# Patient Record
Sex: Female | Born: 1975 | Race: White | Hispanic: No | Marital: Single | Smoking: Never smoker
Health system: Southern US, Community
[De-identification: ages and names within clinical notes are randomized; demographics above are authoritative.]

## PROBLEM LIST (undated history)

## (undated) ENCOUNTER — Inpatient Hospital Stay (HOSPITAL_COMMUNITY): Payer: Self-pay

## (undated) DIAGNOSIS — Z87898 Personal history of other specified conditions: Secondary | ICD-10-CM

## (undated) DIAGNOSIS — K219 Gastro-esophageal reflux disease without esophagitis: Secondary | ICD-10-CM

## (undated) DIAGNOSIS — IMO0001 Reserved for inherently not codable concepts without codable children: Secondary | ICD-10-CM

## (undated) DIAGNOSIS — J45909 Unspecified asthma, uncomplicated: Secondary | ICD-10-CM

## (undated) DIAGNOSIS — B009 Herpesviral infection, unspecified: Secondary | ICD-10-CM

## (undated) DIAGNOSIS — Z9889 Other specified postprocedural states: Secondary | ICD-10-CM

## (undated) DIAGNOSIS — O021 Missed abortion: Secondary | ICD-10-CM

## (undated) DIAGNOSIS — R112 Nausea with vomiting, unspecified: Secondary | ICD-10-CM

## (undated) HISTORY — PX: WISDOM TOOTH EXTRACTION: SHX21

---

## 2003-11-18 ENCOUNTER — Emergency Department (HOSPITAL_COMMUNITY): Admission: EM | Admit: 2003-11-18 | Discharge: 2003-11-18 | Payer: Self-pay | Admitting: Emergency Medicine

## 2005-05-11 ENCOUNTER — Other Ambulatory Visit: Admission: RE | Admit: 2005-05-11 | Discharge: 2005-05-11 | Payer: Self-pay | Admitting: Obstetrics and Gynecology

## 2005-11-16 ENCOUNTER — Inpatient Hospital Stay (HOSPITAL_COMMUNITY): Admission: AD | Admit: 2005-11-16 | Discharge: 2005-11-18 | Payer: Self-pay | Admitting: *Deleted

## 2006-08-18 ENCOUNTER — Emergency Department (HOSPITAL_COMMUNITY): Admission: EM | Admit: 2006-08-18 | Discharge: 2006-08-18 | Payer: Self-pay | Admitting: Family Medicine

## 2007-02-02 ENCOUNTER — Emergency Department (HOSPITAL_COMMUNITY): Admission: EM | Admit: 2007-02-02 | Discharge: 2007-02-02 | Payer: Self-pay | Admitting: Family Medicine

## 2007-02-04 ENCOUNTER — Emergency Department (HOSPITAL_COMMUNITY): Admission: EM | Admit: 2007-02-04 | Discharge: 2007-02-04 | Payer: Self-pay | Admitting: Family Medicine

## 2007-05-02 ENCOUNTER — Inpatient Hospital Stay (HOSPITAL_COMMUNITY): Admission: AD | Admit: 2007-05-02 | Discharge: 2007-05-02 | Payer: Self-pay | Admitting: Obstetrics and Gynecology

## 2007-05-06 ENCOUNTER — Inpatient Hospital Stay (HOSPITAL_COMMUNITY): Admission: AD | Admit: 2007-05-06 | Discharge: 2007-05-06 | Payer: Self-pay | Admitting: Pediatrics

## 2007-05-19 ENCOUNTER — Inpatient Hospital Stay (HOSPITAL_COMMUNITY): Admission: AD | Admit: 2007-05-19 | Discharge: 2007-05-21 | Payer: Self-pay | Admitting: Obstetrics and Gynecology

## 2007-06-08 ENCOUNTER — Inpatient Hospital Stay (HOSPITAL_COMMUNITY): Admission: AD | Admit: 2007-06-08 | Discharge: 2007-06-08 | Payer: Self-pay | Admitting: Obstetrics and Gynecology

## 2009-01-27 ENCOUNTER — Emergency Department (HOSPITAL_COMMUNITY): Admission: EM | Admit: 2009-01-27 | Discharge: 2009-01-27 | Payer: Self-pay | Admitting: Emergency Medicine

## 2009-06-14 ENCOUNTER — Emergency Department (HOSPITAL_COMMUNITY): Admission: EM | Admit: 2009-06-14 | Discharge: 2009-06-14 | Payer: Self-pay | Admitting: Family Medicine

## 2009-06-18 ENCOUNTER — Inpatient Hospital Stay (HOSPITAL_COMMUNITY): Admission: AD | Admit: 2009-06-18 | Discharge: 2009-06-18 | Payer: Self-pay | Admitting: Obstetrics and Gynecology

## 2009-07-04 ENCOUNTER — Encounter: Admission: RE | Admit: 2009-07-04 | Discharge: 2009-07-04 | Payer: Self-pay | Admitting: Obstetrics and Gynecology

## 2009-10-14 ENCOUNTER — Inpatient Hospital Stay (HOSPITAL_COMMUNITY): Admission: AD | Admit: 2009-10-14 | Discharge: 2009-10-14 | Payer: Self-pay | Admitting: Obstetrics and Gynecology

## 2009-10-28 ENCOUNTER — Emergency Department (HOSPITAL_COMMUNITY): Admission: EM | Admit: 2009-10-28 | Discharge: 2009-10-28 | Payer: Self-pay | Admitting: Emergency Medicine

## 2010-03-20 ENCOUNTER — Emergency Department (HOSPITAL_COMMUNITY): Admission: EM | Admit: 2010-03-20 | Discharge: 2010-03-20 | Payer: Self-pay | Admitting: Emergency Medicine

## 2010-08-13 LAB — POCT RAPID STREP A (OFFICE): Streptococcus, Group A Screen (Direct): NEGATIVE

## 2010-08-17 LAB — POCT URINALYSIS DIP (DEVICE)
Bilirubin Urine: NEGATIVE
Glucose, UA: NEGATIVE mg/dL
Nitrite: NEGATIVE
Protein, ur: NEGATIVE mg/dL
Specific Gravity, Urine: 1.025 (ref 1.005–1.030)
Urobilinogen, UA: 1 mg/dL (ref 0.0–1.0)
pH: 7 (ref 5.0–8.0)

## 2010-08-17 LAB — POCT PREGNANCY, URINE: Preg Test, Ur: NEGATIVE

## 2010-08-17 LAB — URINALYSIS, ROUTINE W REFLEX MICROSCOPIC
Bilirubin Urine: NEGATIVE
Glucose, UA: NEGATIVE mg/dL
Ketones, ur: NEGATIVE mg/dL
Leukocytes, UA: NEGATIVE
Nitrite: NEGATIVE
Protein, ur: NEGATIVE mg/dL
Specific Gravity, Urine: <1.005 — ABNORMAL LOW (ref 1.005–1.030)
Urobilinogen, UA: 0.2 mg/dL (ref 0.0–1.0)
pH: 6 (ref 5.0–8.0)

## 2010-08-17 LAB — URINE MICROSCOPIC-ADD ON

## 2010-08-18 LAB — POCT URINALYSIS DIP (DEVICE)
Bilirubin Urine: NEGATIVE
Glucose, UA: NEGATIVE mg/dL
Ketones, ur: NEGATIVE mg/dL
Nitrite: NEGATIVE
Protein, ur: NEGATIVE mg/dL
Specific Gravity, Urine: 1.025 (ref 1.005–1.030)
Urobilinogen, UA: 0.2 mg/dL (ref 0.0–1.0)
pH: 7 (ref 5.0–8.0)

## 2010-08-18 LAB — HCG, QUANTITATIVE, PREGNANCY: hCG, Beta Chain, Quant, S: <2 m[IU]/mL (ref <5)

## 2010-08-18 LAB — GC/CHLAMYDIA PROBE AMP, GENITAL
Chlamydia, DNA Probe: NEGATIVE
GC Probe Amp, Genital: NEGATIVE

## 2010-08-18 LAB — POCT PREGNANCY, URINE: Preg Test, Ur: NEGATIVE
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
## 2010-10-17 NOTE — H&P (Signed)
NAMESHARRELL, KRAWIEC              ACCOUNT NO.:  [id number]   MEDICAL RECORD NO.:  [id number]          PATIENT TYPE:  INP   LOCATION:  9167                          [facility]ACILITY:  [facility]   PHYSICIAN:  Naima A. Dillard, M.D. DATE OF BIRTH:  1975-07-30   DATE OF ADMISSION:  11/16/2005  DATE OF DISCHARGE:                                HISTORY & PHYSICAL   HISTORY OF PRESENT ILLNESS:  This is a 35 year old gravida 5, para 1-0-3-1,  at 39-1/7 weeks, who presents with complaints of ruptured membranes at 2  a.m. with contractions starting earlier in the day and positive fetal  movement.  The pregnancy has been followed by the nurse midwife service and  remarkable for:  #1 - Penicillin allergy, #2 - asthma, #3 - history of HSV-  2, #4 - history of LEEP, #5 - history of EAB x3, #6 - the patient states  group B strep was negative, but records are unavailable.   ALLERGIES:  PENICILLIN causes hives.   OBSTETRICAL HISTORY:  OB history is remarkable for elective abortion in 2000  with no complications, a vaginal delivery in 2002 of a female infant at 87  weeks' gestation weighing 8 pounds 13 ounces, with no complications.  She  had an EAB in 2005 and another one in 2006.   MEDICAL HISTORY:  1.  Medical history is remarkable for abnormal Pap in 2002 with a LEEP that      was done postpartum; Paps were normal after that.  2.  HSV-2 was diagnosed in 2002.  3.  Childhood varicella.  4.  She has a history of allergy-induced asthma with only rare requirements      for inhaler.   FAMILY HISTORY:  Family history is remarkable for 2 grandfathers with heart  disease.   GENETIC HISTORY:  Negative.   SOCIAL HISTORY:  The patient is single.  Father of the baby, Granville Lewis,  lives with her, but is only intermittently involved and refused to come to  the hospital with her tonight.  Her parents did come to the hospital and  picked up her son and they are supportive.  She is of the [employer].  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   PRENATAL LABORATORY DATA:  Hemoglobin 13.8, platelets 304,000.  Blood type A  positive, antibody screen negative.  Toxoplasmosis screen negative.  RPR  nonreactive.  Rubella immune.  Hepatitis negative.  HIV negative.  Pap test  normal with yeast.  Gonorrhea negative.  Chlamydia negative.   HISTORY OF CURRENT PREGNANCY:  The patient entered care at 36 weeks'  gestation.  She declined cystic fibrosis testing.  Cervical length was  measured at 13 weeks and was 4.5-cm long.  She had an ultrasound for anatomy  at 18 weeks, which was normal.  She had an upper respiratory infection at 24  weeks, which resolved.  She had some perineal skin irritation at 22 weeks  which was cultured and found to be negative for HSV and resolved on its own.  Glucola  was normal.  Valtrex prophylaxis was begun at 33 weeks.  She was  seen by a dentist for wisdom tooth swelling at 34 weeks and was treated with  antibiotics.  Group B strep was done at 36 weeks; results are unavailable,  but the patient states it was negative.   OBJECTIVE DATA:  VITAL SIGNS:  Stable, afebrile.  HEENT:  Within normal limits.  NECK:  Thyroid normal, not enlarged.  CHEST:  Clear to auscultation.  HEART:  Regular rate and rhythm.  ABDOMEN:  Gravid, vertex to Leopold's.  Fetal heart rate is reactive with  uterine contractions every 2 minutes.  PELVIC:  Exam reveals grossly ruptured with clear fluid draining from  vagina.  Perineum and vagina are free from apparent lesions, although there  is some excoriation along the left elastic lines where her underwear  irritates her skin.  She states that this is from irritation.  There are no  lesions apparent on this area of erythema.  Cervix is 4 cm, completely  effaced, -2 station with a vertex presentation.  EXTREMITIES:  Within normal limits.   ASSESSMENT:  1.  Intrauterine pregnancy at 39-1/7 weeks.  2.   Spontaneous rupture of membranes with clear fluid.  3.  Early active labor.   PLAN:  1.  Admit to birthing suites, Dr. Normand Sloop notified.  2.  Routine C.N.M. orders.  3.  Desires epidural.      Elby Showers. Williams, C.N.M.      Naima A. Normand Sloop, M.D.     MLW/MEDQ  D:  11/16/2005  T:  11/16/2005  Job:  [id number]

## 2010-11-17 ENCOUNTER — Inpatient Hospital Stay (INDEPENDENT_AMBULATORY_CARE_PROVIDER_SITE_OTHER)
Admission: RE | Admit: 2010-11-17 | Discharge: 2010-11-17 | Disposition: A | Discharge status: Home / Self Care | Payer: Self-pay | Source: Ambulatory Visit | Attending: Family Medicine | Admitting: Family Medicine

## 2010-11-17 ENCOUNTER — Ambulatory Visit (INDEPENDENT_AMBULATORY_CARE_PROVIDER_SITE_OTHER): Payer: Self-pay

## 2010-11-17 DIAGNOSIS — S93609A Unspecified sprain of unspecified foot, initial encounter: Secondary | ICD-10-CM

## 2010-11-21 ENCOUNTER — Ambulatory Visit (INDEPENDENT_AMBULATORY_CARE_PROVIDER_SITE_OTHER): Payer: Self-pay | Admitting: Family Medicine

## 2010-11-21 ENCOUNTER — Encounter: Payer: Self-pay | Admitting: Family Medicine

## 2010-11-21 VITALS — BP 93/67 | HR 67 | Temp 97.9°F | Ht 66.0 in | Wt 135.0 lb

## 2010-11-21 DIAGNOSIS — S93609A Unspecified sprain of unspecified foot, initial encounter: Secondary | ICD-10-CM

## 2010-11-21 DIAGNOSIS — M79672 Pain in left foot: Secondary | ICD-10-CM

## 2010-11-21 DIAGNOSIS — M79609 Pain in unspecified limb: Secondary | ICD-10-CM

## 2010-11-21 DIAGNOSIS — IMO0002 Reserved for concepts with insufficient information to code with codable children: Secondary | ICD-10-CM

## 2010-11-21 MED ORDER — MELOXICAM 15 MG PO TABS: 15.0000 mg | ORAL_TABLET | Freq: Every day | ORAL | Status: AC

## 2010-11-21 MED ORDER — IBUPROFEN 200 MG PO TABS: 400.0000 mg | ORAL_TABLET | Freq: Four times a day (QID) | ORAL | Status: AC | PRN

## 2010-11-21 NOTE — Progress Notes (Signed)
  Subjective:    Patient ID: [name], female    DOB: 1976-02-27, 35 y.o.   MRN: [id number]  HPI 35 year old female sent to the office by Dr. Jerrell Mylar for evaluation of right foot pain with concern for possible injury to the extensor hallucis longus. Patient was playing kickball barefooted with her children 5 days ago and kicked the ground when trying to kick the ball. She felt her foot and toes bend backwards. She went to Urgent Care following day for evaluation, x-rays from that visit were personally reviewed and showed no signs of definitive fracture. She has been wearing a postop shoe, but this has been quite uncomfortable for her. Attempted use of hand-held crutches, but these were not comfortable.  She has been icing intermittently and using Ace wrap intermittently. Only using ibuprofen as needed. She has pain along her arch and the dorsum of her foot with walking, but minimal pain at rest. Does have bruising at base of second and third toes.  Evaluated at family practice earlier today and sent to [place] for possible concern of extensor pollicis longus rupture. Denies any numbness or tingling. Denies any previous injury to the foot. 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  Review of Systems Per history of present illness, otherwise negative    Objective:   Physical Exam GENERAL: Alert x3, appears in mild distress in the office today SKIN: No rashes lesions MSK: - Foot: Right foot with swelling over the dorsum of the foot extending to the base of the toes. Area of ecchymosis at second and third MTP joints on the dorsum of the foot. Mild ecchymosis along the longitudinal arch. No surrounding erythema and no increased warmth. Tender to palpation diffusely throughout the foot, but most tender over first TMT joint and along the long arch and second and third MTP joints. Patient has restricted range of motion of her great toe, but extensor tendon does seem to be intact. Normal  flexion of the toes. No tenderness over the lateral aspect of the foot or base of the fifth metatarsal. She is noted to have a cavus foot. Exam of the left foot is unremarkable. - Gait: Antalgic gait NEURO: Sensation intact to light touch VASCULAR:  Pulses 2+ and symmetric process pedis and posterior tibialis, normal capillary refill.  MSK ultrasound: Right foot- appears to have small avulsion fleck off proximal aspect of first metatarsal. No other bony abnormalities appreciated over the metatarsals or phalanges. Extensor tendons visualized and intact. Normal appearing sesamoids. Normal-appearing flexor tendons. Images saved       Assessment & Plan:

## 2010-11-21 NOTE — Progress Notes (Signed)
Foot pain: Pt has some minimal foot pain but has some tingling and swelling with bruising. She was playing kickball in bare feet on Sunday night (5 days ago) and kicked the ground when she tried to kick the ball. She thought she broke her toe and went to the Urgent Care the next day (Monday night). She had x-rays done which did not show a fracture. She was sent home with a post-op shoe to brace it and told to use Ibuprofen. She was using some hand held crutches from her dad. She comes in today because she says there is a pulling feeling happening in her foot and she can't walk flat footed.   ROS: minimal pain, swelling, bruising, walking out outer foot  PE:  MSK: Left foot normal, Rt foot swelling and bruising, tenderness over the midfoot and at the highest part of the longitudinal arch. Weakness with extension of the hallucis longus tendon, otherwise normal strength. Cap refill is normal, filament testing reveals normal sensation. No heal tenderness or instability of the ankle joint.

## 2010-11-21 NOTE — Patient Instructions (Addendum)
[facility] have [facility] with [facility] and they can see you now.

## 2010-11-21 NOTE — Assessment & Plan Note (Addendum)
Weakness with extesor hallucis longus.  Plan to send [facility] to [facility] for eval with ultrasoudn.

## 2010-11-21 NOTE — Patient Instructions (Signed)
1) You have a foot sprain and what looks like a small break of your proximal 1st metatarsal. 2) Wear your post-op shoe with insert for additional support. 3) Try to apply ace wrap to foot to help with swelling - this should not hurt. 4) Use crutches as needed, may start to walk as pain allows. 5) Take mobic 1 tab daily with food for next 10-days, then as needed.  Do not take with ibuprofen, motrin, naproxen, or aleve. 6) Try to keep elevated as much as possible. 7) Apply ice or ice bath for 15-20 mins 2-3 times a day. 7) Follow-up in 2-weeks for re-evaluation

## 2010-11-21 NOTE — Assessment & Plan Note (Signed)
Left foot sprain, but also small avulsion fracture of proximal first metatarsal dorsally. - She should continue with her postop shoe as she is doing - sports and sole with small scaphoid pad was placed in a postop shoe for added support and was much more comfortable for her. - Recommend she use crutches as needed he can advance weight-bearing as tolerated. We did not have right crutch height for her, was given prescriptions and in she will check around town. If needed we'll call office for a set. - Rx for Mobic given to help with pain inflammation, side effects and how to take the medication properly were reviewed - Try to keep elevated when at rest - Ace wrap will help with swelling in the foot - Followup 2 weeks for reevaluation

## 2010-11-27 ENCOUNTER — Telehealth: Payer: Self-pay | Admitting: *Deleted

## 2010-11-27 NOTE — Telephone Encounter (Signed)
Message copied by Ulyses Jarred on Thu Nov 27, 2010  2:34 PM ------      Message from: CERESI, Shawna Orleans L      Created: Thu Nov 27, 2010  1:42 PM      Regarding: QUESTION REGARDING MEDS      Contact: [phone]       Patient states that the meds she was prescribed is giving her a headache a couple hours after she takes it.  Wasn't sure of the name of the rx.

## 2010-11-27 NOTE — Telephone Encounter (Signed)
Called pt back- left VM to return call.

## 2010-12-02 NOTE — Telephone Encounter (Signed)
Called and left VM- to return call if still having problems.

## 2010-12-12 ENCOUNTER — Ambulatory Visit (INDEPENDENT_AMBULATORY_CARE_PROVIDER_SITE_OTHER): Payer: Self-pay | Admitting: Family Medicine

## 2010-12-12 VITALS — BP 100/62 | HR 69

## 2010-12-12 DIAGNOSIS — M79609 Pain in unspecified limb: Secondary | ICD-10-CM

## 2010-12-12 DIAGNOSIS — M79672 Pain in left foot: Secondary | ICD-10-CM

## 2010-12-17 NOTE — Progress Notes (Signed)
  Subjective:    Patient ID: [name], female    DOB: 1975/07/29, 35 y.o.   MRN: [id number]  HPI  Followup of left foot pain with suspected small avulsion fracture off the metatarsal. She has been wearing the postop shoe all the time. Says she's a little scared to take it off. She has some pain anytime she tries to bend her great toe down.Otherwise  Overall the pain otherwise has resolved. Even when she's walking in the postop shoe she's not currently having pain. She's noticed no new numbness, no more swelling or redness.  Review of Systems Denies fever    Objective:   Physical Exam     GENERAL: Well-developed female no acute distress LEFT  foot: Mildly tender to palpation over the first TMT joint. Flexion and extension of the great toe is normal range and full strength. Hearing mild tenderness with resisted flexion.    Assessment & Plan:  Left foot pain with small avulsion fracture off the distal first metatarsal. This is resolving nicely. I did quickly looked at under the ultrasound today and the edema has improved. I think I can still see to fleck fairly well. I'll have her wean out of the postop shoe wearing it only when she's 22 be doing a lot of standing or walking otherwise wearing a good supportive shoe. She can continue the Mobic. I will see her back in 2 weeks.

## 2010-12-26 ENCOUNTER — Ambulatory Visit: Payer: Self-pay | Admitting: Family Medicine

## 2011-02-19 LAB — URINALYSIS, ROUTINE W REFLEX MICROSCOPIC
Bilirubin Urine: NEGATIVE
Glucose, UA: NEGATIVE
Ketones, ur: 40 — AB
Leukocytes, UA: NEGATIVE
Nitrite: NEGATIVE
Protein, ur: NEGATIVE
Specific Gravity, Urine: 1.02
Urobilinogen, UA: 0.2
pH: 6

## 2011-02-19 LAB — DIFFERENTIAL
Basophils Absolute: 0
Basophils Relative: 0
Eosinophils Absolute: 0
Eosinophils Relative: 0
Lymphocytes Relative: 7 — ABNORMAL LOW
Lymphs Abs: 0.7
Monocytes Absolute: 0.3
Monocytes Relative: 4
Neutro Abs: 8.4 — ABNORMAL HIGH
Neutrophils Relative %: 89 — ABNORMAL HIGH

## 2011-02-19 LAB — URINE MICROSCOPIC-ADD ON

## 2011-02-19 LAB — COMPREHENSIVE METABOLIC PANEL
ALT: 15
AST: 19
Albumin: 3.6
Alkaline Phosphatase: 76
BUN: 11
CO2: 30
Calcium: 8.5
Chloride: 105
Creatinine, Ser: 0.82
GFR calc Af Amer: >60
GFR calc non Af Amer: >60
Glucose, Bld: 94
Potassium: 4.1
Sodium: 142
Total Bilirubin: 0.9
Total Protein: 7.1

## 2011-02-19 LAB — CBC
HCT: 47.2 — ABNORMAL HIGH
Hemoglobin: 16.5 — ABNORMAL HIGH
MCHC: 34.9
MCV: 95.2
Platelets: 426 — ABNORMAL HIGH
RBC: 4.95
RDW: 12.8
WBC: 9.4

## 2011-03-06 LAB — CBC
HCT: 34.8 — ABNORMAL LOW
HCT: 36.9
Hemoglobin: 12.1
Hemoglobin: 13.1
MCHC: 34.7
MCHC: 35.4
MCV: 96.4
MCV: 97.3
Platelets: 211
Platelets: 231
RBC: 3.58 — ABNORMAL LOW
RBC: 3.82 — ABNORMAL LOW
RDW: 13.8
RDW: 14.4
WBC: 7.6
WBC: 9.2

## 2011-03-06 LAB — RPR: RPR Ser Ql: NONREACTIVE

## 2011-03-09 LAB — URINALYSIS, ROUTINE W REFLEX MICROSCOPIC
Bilirubin Urine: NEGATIVE
Glucose, UA: NEGATIVE
Hgb urine dipstick: NEGATIVE
Ketones, ur: NEGATIVE
Nitrite: NEGATIVE
Protein, ur: NEGATIVE
Specific Gravity, Urine: 1.02
Urobilinogen, UA: 0.2
pH: 6.5

## 2011-12-30 ENCOUNTER — Inpatient Hospital Stay (HOSPITAL_COMMUNITY)
Admission: AD | Admit: 2011-12-30 | Discharge: 2011-12-31 | Disposition: A | Discharge status: Home / Self Care | Payer: Medicaid Other | Source: Ambulatory Visit | Attending: Obstetrics and Gynecology | Admitting: Obstetrics and Gynecology

## 2011-12-30 DIAGNOSIS — K219 Gastro-esophageal reflux disease without esophagitis: Secondary | ICD-10-CM | POA: Insufficient documentation

## 2011-12-30 DIAGNOSIS — O99891 Other specified diseases and conditions complicating pregnancy: Secondary | ICD-10-CM | POA: Insufficient documentation

## 2011-12-30 DIAGNOSIS — R079 Chest pain, unspecified: Secondary | ICD-10-CM | POA: Insufficient documentation

## 2011-12-30 NOTE — MAU Note (Signed)
Pt reports she is [redacted] weeks pregnant. LMP 11/09/2011, states she has had a pressure feeling in her chest for a week and has felt that it was acid reflux, has been doing things like only drinking water. states she is constantly burping. Feels like she has to constantly clear her throat. Pain in left shoulder today. States she just doesn't feel well.

## 2011-12-31 ENCOUNTER — Encounter (HOSPITAL_COMMUNITY): Payer: Self-pay | Admitting: *Deleted

## 2011-12-31 DIAGNOSIS — K219 Gastro-esophageal reflux disease without esophagitis: Secondary | ICD-10-CM | POA: Diagnosis present

## 2011-12-31 LAB — URINE MICROSCOPIC-ADD ON

## 2011-12-31 LAB — COMPREHENSIVE METABOLIC PANEL
ALT: 9 U/L (ref 0–35)
AST: 14 U/L (ref 0–37)
Albumin: 3.8 g/dL (ref 3.5–5.2)
Alkaline Phosphatase: 40 U/L (ref 39–117)
BUN: 10 mg/dL (ref 6–23)
CO2: 24 mEq/L (ref 19–32)
Calcium: 8.7 mg/dL (ref 8.4–10.5)
Chloride: 100 mEq/L (ref 96–112)
Creatinine, Ser: 0.58 mg/dL (ref 0.50–1.10)
GFR calc Af Amer: >90 mL/min (ref >90)
GFR calc non Af Amer: >90 mL/min (ref >90)
Glucose, Bld: 91 mg/dL (ref 70–99)
Potassium: 4.1 mEq/L (ref 3.5–5.1)
Sodium: 133 mEq/L — ABNORMAL LOW (ref 135–145)
Total Bilirubin: 0.4 mg/dL (ref 0.3–1.2)
Total Protein: 6.8 g/dL (ref 6.0–8.3)

## 2011-12-31 LAB — URINALYSIS, ROUTINE W REFLEX MICROSCOPIC
Bilirubin Urine: NEGATIVE
Glucose, UA: NEGATIVE mg/dL
Ketones, ur: NEGATIVE mg/dL
Leukocytes, UA: NEGATIVE
Nitrite: NEGATIVE
Protein, ur: NEGATIVE mg/dL
Specific Gravity, Urine: 1.02 (ref 1.005–1.030)
Urobilinogen, UA: 0.2 mg/dL (ref 0.0–1.0)
pH: 5 (ref 5.0–8.0)

## 2011-12-31 LAB — CBC
HCT: 37.5 % (ref 36.0–46.0)
Hemoglobin: 13.2 g/dL (ref 12.0–15.0)
MCH: 31.6 pg (ref 26.0–34.0)
MCHC: 35.2 g/dL (ref 30.0–36.0)
MCV: 89.7 fL (ref 78.0–100.0)
Platelets: 268 10*3/uL (ref 150–400)
RBC: 4.18 MIL/uL (ref 3.87–5.11)
RDW: 12.6 % (ref 11.5–15.5)
WBC: 6.9 10*3/uL (ref 4.0–10.5)

## 2011-12-31 MED ORDER — GI COCKTAIL ~~LOC~~
30.0000 mL | ORAL | Status: AC
  Administered 2011-12-31: 30 mL via ORAL
  Filled 2011-12-31: qty 30

## 2011-12-31 MED ORDER — RANITIDINE HCL 150 MG PO TABS: 150.0000 mg | ORAL_TABLET | Freq: Two times a day (BID) | ORAL | Status: DC

## 2011-12-31 NOTE — MAU Note (Signed)
PT SAYS HAS BEEN FEELING CHEST PAIN  SINCE EASTER- VOMITED -  SINCE HAS HAD TO BE CAREFUL WHAT SHE EATS.    EATS A LOT OF TUMS.     TODAY  DID NOT EAT ANYTHING THAT WOULD MAKE  HER  NAUSEA.    WENT TO  WIC  APPOINTMENT- CHECKED IRON- TOLD HER  HGB- WAS 16.        NOW IN ROOM 10-  SHE FEELS LIKE ITS HARD TO TAKE DEEP BREATH.  FEELS NEED TO BURP.

## 2011-12-31 NOTE — MAU Provider Note (Signed)
History     CSN: [id number]  Arrival date and time: 12/30/11 2349   None    36 y.o. [id number] @7  weeks by LMP Chief Complaint  Patient presents with  . Chest Pain   HPI Pt presents to MAU with chest pressure and feeling like "something is caught in my throat" which started prior to the pregnancy and worsened today.  She also reports frequent belching.  She also describes a sharp pain in between her shoulder blades and more on the left side, which occurred twice today when moving her arm.  She started feeling the chest pressure and throat sensation prior the the pregnancy after vomiting several times with a GI virus.  These symptoms have been worsening since the pregnancy and were especially bad today.  Eating does make symptoms worse and she has not tried any medication to treat her symptoms.  She was told at her [facility] appointment that her hemoglobin was high and she has been anxious about this.  She has applied for Medicaid and has not yet started prenatal care but plans to start at [facility] when insurance begins.  She denies LOF, vaginal bleeding, vaginal itching/burning, urinary symptoms, h/a, dizziness, n/v, or fever/chills.      History reviewed. No pertinent past medical history.  History reviewed. No pertinent past surgical history.  Family History  Problem Relation Age of Onset  . Cancer Maternal Grandmother   . Diabetes Maternal Grandfather     History  Substance Use Topics  . Smoking status: Never Smoker   . Smokeless tobacco: Never Used  . Alcohol Use: No    Allergies:  Allergies  Allergen Reactions  . Penicillins     Prescriptions prior to admission  Medication Sig Dispense Refill  . Multiple Vitamin (MULTIVITAMIN) tablet Take 1 tablet by mouth daily.        Review of Systems  Constitutional: Negative for fever, chills and malaise/fatigue.  HENT: Positive for sore throat.   Eyes: Negative for blurred vision.  Respiratory: Negative for cough and shortness of  breath.   Cardiovascular: Positive for chest pain.  Gastrointestinal: Negative for heartburn, nausea and vomiting.  Genitourinary: Negative for dysuria, urgency and frequency.  Musculoskeletal: Negative.   Neurological: Negative for dizziness and headaches.  Psychiatric/Behavioral: Negative for depression.   Physical Exam   Blood pressure 98/59, pulse 62, temperature 99.1 F (37.3 C), temperature source Oral, resp. rate 20, height 5\' 5"  (1.651 m), weight 63.957 kg (141 lb), last menstrual period 11/09/2011, SpO2 100.00%.  Physical Exam  Nursing note and vitals reviewed. Constitutional: She is oriented to person, place, and time. She appears well-developed and well-nourished.  Neck: Normal range of motion.  Cardiovascular: Normal rate, regular rhythm and normal heart sounds.   Respiratory: Effort normal and breath sounds normal. No respiratory distress. She exhibits no tenderness.  GI: Soft. Bowel sounds are normal. She exhibits no distension. There is no tenderness.  Musculoskeletal: Normal range of motion.  Neurological: She is alert and oriented to person, place, and time.  Skin: Skin is warm and dry.  Psychiatric: She has a normal mood and affect. Her behavior is normal. Judgment and thought content normal.   Results for orders placed during the hospital encounter of 12/30/11 (from the past 24 hour(s))  CBC     Status: Normal   Collection Time   12/31/11 12:50 AM      Component Value Range   WBC 6.9  4.0 - 10.5 K/uL   RBC 4.18  3.87 -  5.11 MIL/uL   Hemoglobin 13.2  12.0 - 15.0 g/dL   HCT 45.4  09.8 - 11.9 %   MCV 89.7  78.0 - 100.0 fL   MCH 31.6  26.0 - 34.0 pg   MCHC 35.2  30.0 - 36.0 g/dL   RDW 14.7  82.9 - 56.2 %   Platelets 268  150 - 400 K/uL  COMPREHENSIVE METABOLIC PANEL     Status: Abnormal   Collection Time   12/31/11 12:50 AM      Component Value Range   Sodium 133 (*) 135 - 145 mEq/L   Potassium 4.1  3.5 - 5.1 mEq/L   Chloride 100  96 - 112 mEq/L   CO2 24  19 -  32 mEq/L   Glucose, Bld 91  70 - 99 mg/dL   BUN 10  6 - 23 mg/dL   Creatinine, Ser 1.30  0.50 - 1.10 mg/dL   Calcium 8.7  8.4 - 86.5 mg/dL   Total Protein 6.8  6.0 - 8.3 g/dL   Albumin 3.8  3.5 - 5.2 g/dL   AST 14  0 - 37 U/L   ALT 9  0 - 35 U/L   Alkaline Phosphatase 40  39 - 117 U/L   Total Bilirubin 0.4  0.3 - 1.2 mg/dL   GFR calc non Af Amer >90  >90 mL/min   GFR calc Af Amer >90  >90 mL/min   EKG indicates normal sinus rhythm  MAU Course  Procedures EKG, CBC, GI Cocktail   Assessment and Plan  Acid reflux of pregnancy  D/C home  Education provided about acid reflux dietary changes Reassurance provided of normal hemoglobin today Zantac 150 mg BID  F/U with early prenatal care at [facility] Return to MAU as needed  LEFTWICH-KIRBY, Landrey Mahurin 12/31/2011, 2:20 AM

## 2011-12-31 NOTE — MAU Note (Signed)
EKG BEING PERFORMED

## 2011-12-31 NOTE — MAU Note (Signed)
PLAN TO GO TO [facility]-  NO APPOINTMENT

## 2012-01-04 NOTE — MAU Provider Note (Signed)
Guidelines for Antenatal Testing and Sonography  (Revised 11/2011)  INDICATION U/S NST/BPP DELIVERY  Diabetes   A1 - good control - 648.83    A2 - good control    Poor control or poor compliance    (Macrosomia or polyhydramnios)    B-C and A2/B - 648.03    D-R-F-T or poor control B-C  20-38  20-38  20-26-30-34-38   20-24-28-32-35-38//fetal echo  20-24-27-30-33-36-38//fetal echo  40  32//2 x wk  32//2 x wk   32//2 x wk  28//BPP wkly then 32//2 x wk  40  39  FLM   39  FLM  CHTN - 642.03   Group I   BP < 140/90, no PIH, AGA,  nml AFV, +/- meds     Group II   BP > 140/90, on meds, no PIH, AGA, nml AFV  20-28-34-38  20-24-28-31-34-37  32//2 x wk  28//BPP wkly then 32//2 x wk  40 no meds; 39 meds  FLM or 39  Pre-eclampsia   Mild - 642.43    Severe - 642.53  Q 2 wks   Q 2 wks  28//BPP wkly then 32//2 x wk  Inpatient  39    PRN  IUGR- 656.53   EFW < 10% w/ AEDF & low AFV or EFW < 3%     EFW < 10%, Nml Dopplers & AFV, no other comorbidities  PRN  28-30-32-34-36-38    Dx//2 x wk then 24//BPP wkly  PRN  39 or PRN  Graves Disease - 648.13 28-32-36 32//2 x wk 39  Multiple Gestation - 651.03       MC/DA    Concordant (< 20%) nml AFV, AGA   Discordant (>20%)               DC/DA   Concordant (<20%), nml AFV, AGA, no other comorbidities      Discordant (> 20%)     Q 2 wks 16-32, q 3wks 32-delivery Q 1 wk, Fluid alternating w/ growth   20-24-28-32-36  Q 2-3 wks   32//2 x wk 24//BPP wkly then 32//2 x wk   35//2 x wk  28//2 x wk   37-38 PRN   38  FLM or 38   Previous Stillbirth (> 28 wks) - V23.5 20-24-28-32-36 28//BPP wkly then 32//2 x wk 39  Oligohydramnios - 658.03   AGA, nml anatomy      AFV 6-10       AFV < 5   Q 2-3 wks  Q 2 wks   28//BPP wkly then 32//2 x wk  28//BPP wkly then 32//2 x wk   39  FLM or 37  Cholestasis - 576.8 At dx, then q 2-3 wks 28//BPP wkly then 32//2 x wk FLM or 37    Polyhydramnios - 657.03   Nml anatomy Q 3 wks 28//BPP wkly then 32//2 x wk 39  Renal Disease - 646.23   Cr > 1.2, proteinuria 20-24-28-32-36 28//BPP wkly then 32//2 x wk 39  SLE (lupus) - 710.0 20-24-28-32-36 32//2 x wk 39  Sickle Cell Disease 20-24-28-32-36 32//2 x wk 39  HIV - 042, 647.63 20-26-32-36 PRN 39  Decreased Fetal Movement- 655.73  2 x wk PRN; BPP wkly if < 32 wks   **2 x wk testing = NST 2 x wk + AFI (modified BPP) wkly** **True BPP needed in pts. With multiple comorbidities**

## 2012-02-08 ENCOUNTER — Telehealth: Payer: Self-pay | Admitting: Obstetrics and Gynecology

## 2012-02-08 NOTE — Telephone Encounter (Signed)
TRIAGE/OB

## 2012-02-08 NOTE — Telephone Encounter (Signed)
Spoke with pt rgd msg pt states went hiking and now have poison oak wants to know what she can take for the itching advised pt can take benadryl, plain zyrtec, or plain Claritin. Advised pt if no relief can go to urgent care and have then eval the poison oak pt voice understanding

## 2012-02-18 ENCOUNTER — Encounter (HOSPITAL_COMMUNITY): Payer: Self-pay | Admitting: *Deleted

## 2012-02-18 ENCOUNTER — Ambulatory Visit: Payer: Medicaid Other

## 2012-02-18 ENCOUNTER — Other Ambulatory Visit: Payer: Self-pay

## 2012-02-18 ENCOUNTER — Ambulatory Visit (INDEPENDENT_AMBULATORY_CARE_PROVIDER_SITE_OTHER): Payer: Medicaid Other

## 2012-02-18 DIAGNOSIS — IMO0002 Reserved for concepts with insufficient information to code with codable children: Secondary | ICD-10-CM

## 2012-02-18 DIAGNOSIS — O039 Complete or unspecified spontaneous abortion without complication: Secondary | ICD-10-CM

## 2012-02-18 LAB — CBC
HCT: 38.8 % (ref 36.0–46.0)
Hemoglobin: 13.8 g/dL (ref 12.0–15.0)
MCH: 31.6 pg (ref 26.0–34.0)
MCHC: 35.6 g/dL (ref 30.0–36.0)
MCV: 88.8 fL (ref 78.0–100.0)
Platelets: 324 10*3/uL (ref 150–400)
RBC: 4.37 MIL/uL (ref 3.87–5.11)
RDW: 13.1 % (ref 11.5–15.5)
WBC: 5.7 10*3/uL (ref 4.0–10.5)

## 2012-02-18 LAB — US OB COMP LESS 14 WKS

## 2012-02-19 ENCOUNTER — Encounter (HOSPITAL_COMMUNITY): Payer: Self-pay | Admitting: Pharmacist

## 2012-02-19 ENCOUNTER — Ambulatory Visit (HOSPITAL_COMMUNITY): Payer: Medicaid Other | Admitting: Anesthesiology

## 2012-02-19 ENCOUNTER — Other Ambulatory Visit: Payer: Self-pay

## 2012-02-19 ENCOUNTER — Encounter (HOSPITAL_COMMUNITY)
Admission: AD | Disposition: A | Discharge status: Home / Self Care | Payer: Self-pay | Source: Ambulatory Visit | Attending: Obstetrics and Gynecology

## 2012-02-19 ENCOUNTER — Encounter (HOSPITAL_COMMUNITY): Payer: Self-pay | Admitting: Anesthesiology

## 2012-02-19 ENCOUNTER — Ambulatory Visit (HOSPITAL_COMMUNITY)
Admission: AD | Admit: 2012-02-19 | Discharge: 2012-02-19 | Disposition: A | Discharge status: Home / Self Care | Payer: Medicaid Other | Source: Ambulatory Visit | Attending: Obstetrics and Gynecology | Admitting: Obstetrics and Gynecology

## 2012-02-19 DIAGNOSIS — Z87898 Personal history of other specified conditions: Secondary | ICD-10-CM | POA: Insufficient documentation

## 2012-02-19 DIAGNOSIS — B009 Herpesviral infection, unspecified: Secondary | ICD-10-CM

## 2012-02-19 DIAGNOSIS — Z9889 Other specified postprocedural states: Secondary | ICD-10-CM | POA: Insufficient documentation

## 2012-02-19 DIAGNOSIS — J45909 Unspecified asthma, uncomplicated: Secondary | ICD-10-CM | POA: Insufficient documentation

## 2012-02-19 DIAGNOSIS — O021 Missed abortion: Secondary | ICD-10-CM | POA: Insufficient documentation

## 2012-02-19 HISTORY — DX: Unspecified asthma, uncomplicated: J45.909

## 2012-02-19 HISTORY — DX: Herpesviral infection, unspecified: B00.9

## 2012-02-19 HISTORY — DX: Reserved for inherently not codable concepts without codable children: IMO0001

## 2012-02-19 HISTORY — DX: Personal history of other specified conditions: Z87.898

## 2012-02-19 HISTORY — DX: Other specified postprocedural states: Z98.890

## 2012-02-19 HISTORY — DX: Gastro-esophageal reflux disease without esophagitis: K21.9

## 2012-02-19 HISTORY — DX: Missed abortion: O02.1

## 2012-02-19 HISTORY — DX: Nausea with vomiting, unspecified: R11.2

## 2012-02-19 HISTORY — PX: DILATION AND EVACUATION: SHX1459

## 2012-02-19 SURGERY — DILATION AND EVACUATION, UTERUS
Anesthesia: General | Site: Uterus | Wound class: Clean Contaminated

## 2012-02-19 MED ORDER — HYDROCODONE-ACETAMINOPHEN 5-500 MG PO TABS: 1.0000 | ORAL_TABLET | Freq: Four times a day (QID) | ORAL | Status: DC | PRN

## 2012-02-19 MED ORDER — FENTANYL CITRATE 0.05 MG/ML IJ SOLN
INTRAMUSCULAR | Status: DC | PRN
  Administered 2012-02-19: 100 ug via INTRAVENOUS

## 2012-02-19 MED ORDER — LACTATED RINGERS IV SOLN
INTRAVENOUS | Status: DC
  Administered 2012-02-19 (×2): via INTRAVENOUS

## 2012-02-19 MED ORDER — LIDOCAINE HCL 2 % IJ SOLN
INTRAMUSCULAR | Status: AC
  Filled 2012-02-19: qty 20

## 2012-02-19 MED ORDER — KETOROLAC TROMETHAMINE 60 MG/2ML IM SOLN
INTRAMUSCULAR | Status: AC
  Filled 2012-02-19: qty 2

## 2012-02-19 MED ORDER — DEXAMETHASONE SODIUM PHOSPHATE 10 MG/ML IJ SOLN
INTRAMUSCULAR | Status: DC | PRN
  Administered 2012-02-19: 10 mg via INTRAVENOUS

## 2012-02-19 MED ORDER — MIDAZOLAM HCL 5 MG/5ML IJ SOLN
INTRAMUSCULAR | Status: DC | PRN
  Administered 2012-02-19: 2 mg via INTRAVENOUS

## 2012-02-19 MED ORDER — SCOPOLAMINE 1 MG/3DAYS TD PT72
MEDICATED_PATCH | TRANSDERMAL | Status: AC
  Filled 2012-02-19: qty 1

## 2012-02-19 MED ORDER — PROPOFOL INFUSION 10 MG/ML OPTIME
INTRAVENOUS | Status: DC | PRN
  Administered 2012-02-19: 75 ug/kg/min via INTRAVENOUS

## 2012-02-19 MED ORDER — ONDANSETRON HCL 4 MG/2ML IJ SOLN
INTRAMUSCULAR | Status: AC
  Filled 2012-02-19: qty 2

## 2012-02-19 MED ORDER — MIDAZOLAM HCL 2 MG/2ML IJ SOLN
INTRAMUSCULAR | Status: AC
  Filled 2012-02-19: qty 2

## 2012-02-19 MED ORDER — SCOPOLAMINE 1 MG/3DAYS TD PT72
1.0000 | MEDICATED_PATCH | Freq: Once | TRANSDERMAL | Status: DC
  Administered 2012-02-19: 1.5 mg via TRANSDERMAL

## 2012-02-19 MED ORDER — DOXYCYCLINE HYCLATE 50 MG PO CAPS: 100.0000 mg | ORAL_CAPSULE | Freq: Two times a day (BID) | ORAL | Status: DC

## 2012-02-19 MED ORDER — PROPOFOL 10 MG/ML IV EMUL
INTRAVENOUS | Status: AC
  Filled 2012-02-19: qty 40

## 2012-02-19 MED ORDER — HYDROCODONE-ACETAMINOPHEN 5-325 MG PO TABS
ORAL_TABLET | ORAL | Status: AC
  Administered 2012-02-19: 1 via ORAL
  Filled 2012-02-19: qty 1

## 2012-02-19 MED ORDER — FENTANYL CITRATE 0.05 MG/ML IJ SOLN
INTRAMUSCULAR | Status: AC
  Filled 2012-02-19: qty 2

## 2012-02-19 MED ORDER — FENTANYL CITRATE 0.05 MG/ML IJ SOLN: 25.0000 ug | INTRAMUSCULAR | Status: DC | PRN

## 2012-02-19 MED ORDER — PROPOFOL 10 MG/ML IV EMUL
INTRAVENOUS | Status: DC | PRN
  Administered 2012-02-19: 200 mg via INTRAVENOUS

## 2012-02-19 MED ORDER — KETOROLAC TROMETHAMINE 30 MG/ML IJ SOLN
INTRAMUSCULAR | Status: DC | PRN
  Administered 2012-02-19: 30 mg via INTRAVENOUS

## 2012-02-19 MED ORDER — LIDOCAINE HCL (CARDIAC) 20 MG/ML IV SOLN
INTRAVENOUS | Status: AC
  Filled 2012-02-19: qty 5

## 2012-02-19 MED ORDER — HYDROCODONE-ACETAMINOPHEN 5-325 MG PO TABS
1.0000 | ORAL_TABLET | Freq: Once | ORAL | Status: AC
  Administered 2012-02-19: 1 via ORAL

## 2012-02-19 MED ORDER — LIDOCAINE HCL 2 % IJ SOLN
INTRAMUSCULAR | Status: DC | PRN
  Administered 2012-02-19: 20 mL

## 2012-02-19 MED ORDER — KETOROLAC TROMETHAMINE 60 MG/2ML IM SOLN
INTRAMUSCULAR | Status: DC | PRN
  Administered 2012-02-19: 30 mg via INTRAMUSCULAR

## 2012-02-19 MED ORDER — LIDOCAINE HCL (CARDIAC) 20 MG/ML IV SOLN
INTRAVENOUS | Status: DC | PRN
  Administered 2012-02-19: 80 mg via INTRAVENOUS

## 2012-02-19 MED ORDER — IBUPROFEN 600 MG PO TABS: 600.0000 mg | ORAL_TABLET | Freq: Four times a day (QID) | ORAL | Status: DC | PRN

## 2012-02-19 MED ORDER — DEXAMETHASONE SODIUM PHOSPHATE 10 MG/ML IJ SOLN
INTRAMUSCULAR | Status: AC
  Filled 2012-02-19: qty 1

## 2012-02-19 MED ORDER — ONDANSETRON HCL 4 MG/2ML IJ SOLN
INTRAMUSCULAR | Status: DC | PRN
  Administered 2012-02-19: 4 mg via INTRAVENOUS

## 2012-02-19 SURGICAL SUPPLY — 19 items
CATH ROBINSON RED A/P 16FR (CATHETERS) ×2 IMPLANT
CLOTH BEACON ORANGE TIMEOUT ST (SAFETY) ×2 IMPLANT
DECANTER SPIKE VIAL GLASS SM (MISCELLANEOUS) ×2 IMPLANT
GLOVE BIOGEL PI IND STRL 7.0 (GLOVE) ×1 IMPLANT
GLOVE BIOGEL PI INDICATOR 7.0 (GLOVE) ×1
GOWN PREVENTION PLUS LG XLONG (DISPOSABLE) ×2 IMPLANT
GOWN STRL REIN XL XLG (GOWN DISPOSABLE) ×2 IMPLANT
KIT BERKELEY 1ST TRIMESTER 3/8 (MISCELLANEOUS) ×2 IMPLANT
NDL SPNL 22GX3.5 QUINCKE BK (NEEDLE) ×1 IMPLANT
NEEDLE SPNL 22GX3.5 QUINCKE BK (NEEDLE) ×2 IMPLANT
NS IRRIG 1000ML POUR BTL (IV SOLUTION) ×2 IMPLANT
PACK VAGINAL MINOR WOMEN LF (CUSTOM PROCEDURE TRAY) ×2 IMPLANT
PAD OB MATERNITY 4.3X12.25 (PERSONAL CARE ITEMS) ×2 IMPLANT
PAD PREP 24X48 CUFFED NSTRL (MISCELLANEOUS) ×2 IMPLANT
SET BERKELEY SUCTION TUBING (SUCTIONS) ×2 IMPLANT
SYR 20CC LL (SYRINGE) ×2 IMPLANT
TOWEL OR 17X24 6PK STRL BLUE (TOWEL DISPOSABLE) ×4 IMPLANT
VACURETTE 10 RIGID CVD (CANNULA) ×1 IMPLANT
WATER STERILE IRR 1000ML POUR (IV SOLUTION) ×2 IMPLANT

## 2012-02-19 NOTE — Op Note (Signed)
Pre op DX :  missed abortion Postoperative Diagnosis: same Procedure:  dilation and evacuation Anesthesia:  MAC and local Surgeon:  Dr. Normand Sloop Asst : none Complications : none Procedure in detail: The patient was taken to the operating room where she was given MAC anesthesia. She was placed in dorsal lithotomy position and prepped and draped in normal sterile fashion. In and out catheter was used to drain the bladder. This was examined and noted to have a 12 week size uterus with no adnexal masses. A bivalve was placed into the vagina. A tenaculum was placed on the cervix. The cervix was infiltrated with 20 cc 2% xylocaine paracervical block. The cervix then dilated with dilators up to 27.  A size 10 suction curettage was placed into the uterine cavity. A scant amount of products of conception was seen. The suction curettage was removed when a gritty texture was noted. A sharp curette was done along all walls  of the uterus. The suction curet was placed back into the uterine cavity. No further products of conception were obtained. Sponge lap and needle counts were correct. Patient back in stable condition. The patient understood to be the risks to be, but not  limited to,  Bleeding,  Infection,  damage to internal organs by perforation of the uterus and Asherman syndrome (scarring in the uterus) leading to infertility.

## 2012-02-19 NOTE — Anesthesia Procedure Notes (Signed)
Procedure Name: LMA Insertion Date/Time: 02/19/2012 1:12 PM Performed by: Graciela Husbands Pre-anesthesia Checklist: Patient identified, Patient being monitored, Emergency Drugs available, Timeout performed and Suction available Patient Re-evaluated:Patient Re-evaluated prior to inductionOxygen Delivery Method: Circle system utilized Preoxygenation: Pre-oxygenation with 100% oxygen Intubation Type: IV induction LMA: LMA inserted LMA Size: 4.0 Number of attempts: 1 Placement Confirmation: positive ETCO2 and breath sounds checked- equal and bilateral Tube secured with: Tape Dental Injury: Teeth and Oropharynx as per pre-operative assessment

## 2012-02-19 NOTE — Progress Notes (Signed)
Patient ID: [name], female   DOB: 05/03/1976, 35 y.o.   MRN: [id number]  HPI [name] is a 35 y.o. white female who presents for a scheduled D&E s/p diagnosis of MAB in office yesterday.  Pt was in office for Nurse interview, and asked if she would be able to "hear baby's heartbeat" despite not having other appt.  When attempted to obtain FHT, none found, and u/s obtained.  U/s showed absent FHT and AUA=[redacted]w[redacted]d.   Pt reported LMP 11/09/11, recalling normal, regular periods in May and June.  Pt denies any VB, abnormal d/c, recent illness or fever, but did state breast tenderness had resolved, and that is how she has "always known she was pregnant in the past."  No resp c/o's.  Reports significant "reflux" since pregnancy onset, and after chart review, pt seen in MAU 7/31-8/1 for chest pain, frequent belching, pain in shoulder blades w/ movement, and feeling of fullness in throat, especially after eating.  The symptoms had been present since "Easter," but worse that night.  Pt had normal EKG, CBC, and CMET (Na slightly low=133).  Zantac did help, but she had delay in getting b/c awaiting Medicaid approval.  Reports changes in diet have helped the most.  Pt denies any UTI s/s, or other c/o's. Accompanied to [facility] by her daughter who will be 5y.o. This December. Pt does desire future pregnancies.  She was tearful after dx disc'd, and appropriate.  HPI--see above  Past Medical History  Diagnosis Date  . SVD (spontaneous vaginal delivery)     x 3  . Reflux   . PONV (postoperative nausea and vomiting)   . Missed abortion 02/19/2012  . History of induced abortion 02/19/2012    EAB x3 w/ G1 ('00), G3 ('05), G4 ('06)  . 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 Asthma 02/19/2012  . History of abnormal Pap smear 02/19/2012    2002; had colpo's and questionable LEEP    Past Surgical History  Procedure Date  . Wisdom tooth extraction     Family History  Problem Relation Age of Onset  . Cancer  Maternal Grandmother   . Diabetes Maternal Grandfather     Social History History  Substance Use Topics  . Smoking status: Never Smoker   . Smokeless tobacco: Never Used  . Alcohol Use: No    Allergies  Allergen Reactions  . Penicillins Hives    Current Outpatient Prescriptions  Medication Sig Dispense Refill  . Prenatal Vit-Fe Fumarate-FA (PRENATAL MULTIVITAMIN) TABS Take 1 tablet by mouth daily.      . ranitidine (ZANTAC) 150 MG capsule Take 150 mg by mouth 2 (two) times daily as needed. For bloating/GI upset        Review of Systems Review of Systems  Constitutional: Negative.   HENT: Negative.   Eyes: Negative.   Respiratory: Negative.   Cardiovascular: Negative.   Gastrointestinal: Positive for nausea.  Genitourinary: Negative.   Skin: Negative.     Last menstrual period 11/09/2011.  Physical Exam Physical Exam  Constitutional: She is oriented to person, place, and time. She appears well-developed and well-nourished. No distress.  HENT:  Head: Normocephalic and atraumatic.  Eyes: Pupils are equal, round, and reactive to light.  Cardiovascular: Normal rate and regular rhythm.   Pulmonary/Chest: Effort normal and breath sounds normal.  Abdominal: Soft. Bowel sounds are normal.  Genitourinary:       Pelvic exam deferred  Neurological: She is alert and oriented to   person, place, and time. She has normal reflexes.  Skin: Skin is warm and dry.  .. Results for orders placed in visit on 02/18/12 (from the past 24 hour(s))  CBC     Status: Normal   Collection Time   02/18/12  2:45 PM      Component Value Range   WBC 5.7  4.0 - 10.5 K/uL   RBC 4.37  3.87 - 5.11 MIL/uL   Hemoglobin 13.8  12.0 - 15.0 g/dL   HCT 38.8  36.0 - 46.0 %   MCV 88.8  78.0 - 100.0 fL   MCH 31.6  26.0 - 34.0 pg   MCHC 35.6  30.0 - 36.0 g/dL   RDW 13.1  11.5 - 15.5 %   Platelets 324  150 - 400 K/uL   Narrative:    Performed at:  [facility]                [address],  [address]                [place], [place] [address]    Assessment  1.  MAB 2.  [redacted]w[redacted]d by LMP of 11/09/11, but AUA on u/s yesterday=[redacted]w[redacted]d 3.  A pos 4.  GERD exacerbations this pregnancy   Plan 1.  Admit to [facility] w/ Dr. Dillard as attending; routine preop orders. 2.  Pt given options of expectant management, induction w/ cytotec, or D&E; r/b/a rev'd of each, and following discussion, pt desired to proceed w/ D&E.  She states her s.o. is "on leave" next week, and will be able to help her w/ kids for her to recuperate.  She has questions r/e resumption of intercourse and planning next pregnancy, and rec'd pt further discuss w/ MD. 3.  CBC drawn at office yesterday and WNL 4.  MD to follow        Korayma Hagwood H 02/19/2012, 11:20 AM    

## 2012-02-19 NOTE — Transfer of Care (Signed)
Immediate Anesthesia Transfer of Care Note  Patient: [name]  Procedure(s) Performed: Procedure(s) (LRB) with comments: DILATATION AND EVACUATION (N/A)  Patient Location: PACU  Anesthesia Type: General  Level of Consciousness: awake, alert  and oriented  Airway & Oxygen Therapy: Patient Spontanous Breathing and Patient connected to nasal cannula oxygen  Post-op Assessment: Report given to PACU RN and Post -op Vital signs reviewed and stable  Post vital signs: Reviewed and stable  Complications: No apparent anesthesia complications

## 2012-02-19 NOTE — H&P (View-Only) (Signed)
Patient ID: [name], female   DOB: 14-Feb-1976, 36 y.o.   MRN: [id number]  HPI [name] is a 36 y.o. white female who presents for a scheduled D&E s/p diagnosis of MAB in office yesterday.  Pt was in office for Nurse interview, and asked if she would be able to "hear baby's heartbeat" despite not having other appt.  When attempted to obtain FHT, none found, and u/s obtained.  U/s showed absent FHT and AUA=[redacted]w[redacted]d.   Pt reported LMP 11/09/11, recalling normal, regular periods in May and June.  Pt denies any VB, abnormal d/c, recent illness or fever, but did state breast tenderness had resolved, and that is how she has "always known she was pregnant in the past."  No resp c/o's.  Reports significant "reflux" since pregnancy onset, and after chart review, pt seen in MAU 7/31-8/1 for chest pain, frequent belching, pain in shoulder blades w/ movement, and feeling of fullness in throat, especially after eating.  The symptoms had been present since "Easter," but worse that night.  Pt had normal EKG, CBC, and CMET (Na slightly low=133).  Zantac did help, but she had delay in getting b/c awaiting Medicaid approval.  Reports changes in diet have helped the most.  Pt denies any UTI s/s, or other c/o's. Accompanied to [facility] by her daughter who will be 5y.o. This December. Pt does desire future pregnancies.  She was tearful after dx disc'd, and appropriate.  HPI--see above  Past Medical History  Diagnosis Date  . SVD (spontaneous vaginal delivery)     x 3  . Reflux   . PONV (postoperative nausea and vomiting)   . Missed abortion 02/19/2012  . History of induced abortion 02/19/2012    EAB x3 w/ G1 ('00), G3 ('05), G4 ('06)  . 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 Asthma 02/19/2012  . History of abnormal Pap smear 02/19/2012    2002; had colpo's and questionable LEEP    Past Surgical History  Procedure Date  . Wisdom tooth extraction     Family History  Problem Relation Age of Onset  . Cancer  Maternal Grandmother   . Diabetes Maternal Grandfather     Social History History  Substance Use Topics  . Smoking status: Never Smoker   . Smokeless tobacco: Never Used  . Alcohol Use: No    Allergies  Allergen Reactions  . Penicillins Hives    Current Outpatient Prescriptions  Medication Sig Dispense Refill  . Prenatal Vit-Fe Fumarate-FA (PRENATAL MULTIVITAMIN) TABS Take 1 tablet by mouth daily.      . ranitidine (ZANTAC) 150 MG capsule Take 150 mg by mouth 2 (two) times daily as needed. For bloating/GI upset        Review of Systems Review of Systems  Constitutional: Negative.   HENT: Negative.   Eyes: Negative.   Respiratory: Negative.   Cardiovascular: Negative.   Gastrointestinal: Positive for nausea.  Genitourinary: Negative.   Skin: Negative.     Last menstrual period 11/09/2011.  Physical Exam Physical Exam  Constitutional: She is oriented to person, place, and time. She appears well-developed and well-nourished. No distress.  HENT:  Head: Normocephalic and atraumatic.  Eyes: Pupils are equal, round, and reactive to light.  Cardiovascular: Normal rate and regular rhythm.   Pulmonary/Chest: Effort normal and breath sounds normal.  Abdominal: Soft. Bowel sounds are normal.  Genitourinary:       Pelvic exam deferred  Neurological: She is alert and oriented to  person, place, and time. She has normal reflexes.  Skin: Skin is warm and dry.  .. Results for orders placed in visit on 02/18/12 (from the past 24 hour(s))  CBC     Status: Normal   Collection Time   02/18/12  2:45 PM      Component Value Range   WBC 5.7  4.0 - 10.5 K/uL   RBC 4.37  3.87 - 5.11 MIL/uL   Hemoglobin 13.8  12.0 - 15.0 g/dL   HCT 78.2  95.6 - 21.3 %   MCV 88.8  78.0 - 100.0 fL   MCH 31.6  26.0 - 34.0 pg   MCHC 35.6  30.0 - 36.0 g/dL   RDW 08.6  57.8 - 46.9 %   Platelets 324  150 - 400 K/uL   Narrative:    Performed at:  [facility]                [address]  [address]                [place], [place] [address]    Assessment  1.  MAB 2.  [redacted]w[redacted]d by LMP of 11/09/11, but AUA on u/s yesterday=[redacted]w[redacted]d 3.  A pos 4.  GERD exacerbations this pregnancy   Plan 1.  Admit to [facility] w/ Dr. Normand Sloop as attending; routine preop orders. 2.  Pt given options of expectant management, induction w/ cytotec, or D&E; r/b/a rev'd of each, and following discussion, pt desired to proceed w/ D&E.  She states her s.o. is "on leave" next week, and will be able to help her w/ kids for her to recuperate.  She has questions r/e resumption of intercourse and planning next pregnancy, and rec'd pt further discuss w/ MD. 3.  CBC drawn at office yesterday and WNL 4.  MD to follow        Kiki Bivens H 34/20/2013, 11:20 AM

## 2012-02-19 NOTE — Interval H&P Note (Signed)
History and Physical Interval Note:  02/19/2012 12:47 PM  [name]  has presented today for surgery, with the diagnosis of missed abortion  The various methods of treatment have been discussed with the patient and family. After consideration of risks, benefits and other options for treatment, the patient has consented to  Procedure(s) (LRB) with comments: DILATATION AND EVACUATION (N/A) as a surgical intervention .  The patient's history has been reviewed, patient examined, no change in status, stable for surgery.  I have reviewed the patient's chart and labs.  Questions were answered to the patient's satisfaction.     [id number],[id number] A  Date of Initial H&P: 02/19/12  History reviewed, patient examined, no change in status, stable for surgery. Blood type A positive

## 2012-02-19 NOTE — Anesthesia Preprocedure Evaluation (Signed)
Anesthesia Evaluation  Patient identified by MRN, date of birth, ID band Patient awake    Reviewed: Allergy & Precautions, H&P , Patient's Chart, lab work & pertinent test results, reviewed documented beta blocker date and time   History of Anesthesia Complications (+) PONV  Airway Mallampati: II TM Distance: >3 FB Neck ROM: full    Dental No notable dental hx.    Pulmonary  breath sounds clear to auscultation  Pulmonary exam normal       Cardiovascular Rhythm:regular Rate:Normal     Neuro/Psych    GI/Hepatic GERD-  Medicated,  Endo/Other    Renal/GU      Musculoskeletal   Abdominal   Peds  Hematology   Anesthesia Other Findings   Reproductive/Obstetrics                           Anesthesia Physical Anesthesia Plan  ASA: II  Anesthesia Plan: General   Post-op Pain Management:    Induction: Intravenous  Airway Management Planned: LMA  Additional Equipment:   Intra-op Plan:   Post-operative Plan:   Informed Consent: I have reviewed the patients History and Physical, chart, labs and discussed the procedure including the risks, benefits and alternatives for the proposed anesthesia with the patient or authorized representative who has indicated his/her understanding and acceptance.   Dental Advisory Given  Plan Discussed with: CRNA and Surgeon  Anesthesia Plan Comments: (  Discussed  general anesthesia, including possible nausea, instrumentation of airway, sore throat,pulmonary aspiration, etc. I asked if the were any outstanding questions, or  concerns before we proceeded. )        Anesthesia Quick Evaluation

## 2012-02-21 ENCOUNTER — Encounter (HOSPITAL_COMMUNITY): Payer: Self-pay | Admitting: Obstetrics and Gynecology

## 2012-02-22 ENCOUNTER — Telehealth: Payer: Self-pay | Admitting: Obstetrics and Gynecology

## 2012-02-22 ENCOUNTER — Other Ambulatory Visit: Payer: Self-pay | Admitting: Obstetrics and Gynecology

## 2012-02-22 ENCOUNTER — Encounter: Payer: Self-pay | Admitting: Obstetrics and Gynecology

## 2012-02-22 NOTE — Telephone Encounter (Signed)
Clarifying order:  Doxycyline 100 mg po BID x 7 days. Triage to call in to patient's pharmacy--had not been sent upon D/C after D&C on Friday./

## 2012-02-22 NOTE — Telephone Encounter (Signed)
TC from patient--had D&E with ND on Friday for missed at at 14 weeks by LMP, 12 weeks by size at [place]. Rx'd with doxycycline post-op, but patient didn't realize that Rx had been called in, so she has not picked it up yet (pharmacy notified her today after hours that Rx was waiting). Patient questioned if she were at risk due to delay in starting Rx. Reassured, OK to start tomorrow, and patient is to complete the course of med as Rx'd. Questions regarding loss reviewed, support offered. To f/u as scheduled in 2 weeks or prn.

## 2012-02-22 NOTE — Telephone Encounter (Signed)
Spoke with pt rgd msg pt states rx for doxy not at [facility] consult with VL pt to have doxy 100 mg bid times 7 days disp 0 rf informed pt rx called to pharm pt voice understanding

## 2012-02-22 NOTE — Anesthesia Postprocedure Evaluation (Signed)
  Anesthesia Post-op Note  Patient: [name]  Procedure(s) Performed: Procedure(s) (LRB) with comments: DILATATION AND EVACUATION (N/A) Patient is awake and responsive. Pain and nausea are reasonably well controlled. Vital signs are stable and clinically acceptable. Oxygen saturation is clinically acceptable. There are no apparent anesthetic complications at this time. Patient is ready for discharge.

## 2012-02-23 ENCOUNTER — Telehealth: Payer: Self-pay | Admitting: Obstetrics and Gynecology

## 2012-02-23 NOTE — Telephone Encounter (Signed)
Ms hillmer called this morning 02/23/12. She is day 4 POD D&E fro Fetal demise 11 weeks approx and had reached [redacted] weeks gestation prior to diagnosis on USS. At present taking Doxycycline 100 mgs BID, Ibuprofen 600 mg po Q 6 hrly PRN. Today called for Symptoms of body aches, Abdominal cramps and some GI Bloating (Advised GaseX or Beanos) Her Lochia is described as Rubra and Minimal Advised to take her medications as per directions. She has had Acid reflux ++ and wanted to know if she should take her Zantac as before D&C. Advised to take Zantac 150mg  po BID as before for acid reflux. The patient needed reassurance about her condition and the fetal demise. She really just wanted to talk to see if her post op course was on track. The patient was to be seen at the office yesterday but she did not keep appointment. Her condition dsecribed by telephone would be considered stable but the patient was advised to call for nay concerns for evaluation. The patient was content with the information and support offered. Earl Gala, CNM.

## 2012-03-03 ENCOUNTER — Encounter: Payer: Self-pay | Admitting: Obstetrics and Gynecology

## 2012-03-03 ENCOUNTER — Ambulatory Visit (INDEPENDENT_AMBULATORY_CARE_PROVIDER_SITE_OTHER): Payer: Medicaid Other | Admitting: Obstetrics and Gynecology

## 2012-03-03 VITALS — BP 100/60 | Ht 66.0 in | Wt 137.0 lb

## 2012-03-03 DIAGNOSIS — Z9889 Other specified postprocedural states: Secondary | ICD-10-CM

## 2012-03-03 NOTE — Patient Instructions (Signed)
Spermicide Spermicides are chemicals that kill or block sperm from entering the cervix and uterus. Spermicides come in the form of creams, jellies, suppositories, foam, film, or tablets. The film, tablets, and suppositories should be inserted 10 to 30 minutes before sexual intercourse so they can dissolve. They are inserted into the vagina with an applicator before having sexual intercourse. This must be repeated every time you have sexual intercourse. Spermicides can keep working for 6 to 8 hours after they are inserted. ADVANTAGES  They are inexpensive.  They do not affect female hormones.  They are not associated with birth defects, if pregnancy occurs.  They can be used while breastfeeding.  They can be used with a condom, diaphragm, or cervical cap. DISADVANTAGES  The pregnancy rate is higher than other barrier methods of contraception.  They are messy and may leak from the vagina.  They may cause irritation of the vagina or penis.  They must be inserted each time you have sexual intercourse.  The film, tablets, and suppositories should be inserted 10 to 30 minutes before sexual intercourse so they can dissolve.  They do not protect against STDs or HIV.  They may make you prone to bladder or vaginal infections. HOME CARE INSTRUCTIONS  Spermicides are less effective if they are not used properly. Here are some important considerations:  Do not douche for at least 6 to 8 hours after using spermicide. Douching, even with water, can rinse the spermicide out of the vagina before it has killed all the sperm.  Follow the package instructions carefully.  Be sure that the spermicide is inserted deep into the vagina.  Use another application of the spermicide for each episode of intercourse.  Wait the recommended amount of time between application of the spermicide and intercourse.  If you want to use a spermicide with another birth control device, make sure it is safe to do  so.  Keep the spermicide container away from heat, cold, direct light, and moisture. Do not store it in the refrigerator, bathroom, or other damp places.  Do not use expired spermicide. Throw it away if it is expired. SEEK MEDICAL CARE IF:   You have a fever.  You have painful urination.  You have a vaginal irritation, discharge, or itching.  You miss your menstrual period and think you may be pregnant.  You have spotting or bleeding after sexual intercourse. Document Released: 08/08/2002 Document Revised: 08/10/2011 Document Reviewed: 09/20/2010 [facility] Patient Information 2013 [place], [place].

## 2012-03-03 NOTE — Progress Notes (Signed)
DATE OF SURGERY: 02/19/12 TYPE OF SURGERY:D & E PAIN:Yes severe pain with BM with severe cramping VAG BLEEDING: yes VAG DISCHARGE: no NORMAL GI FUNCTN: no NORMAL GU FUNCTN: yes BP 100/60  Ht 5\' 6"  (1.676 m)  Wt 137 lb (62.143 kg)  BMI 22.11 kg/m2  LMP 11/09/2011 Physical Examination: General appearance - alert, well appearing, and in no distress Abdomen - soft, nontender, nondistended, no masses or organomegaly Pelvic - normal external genitalia, vulva, vagina, cervix, uterus and adnexa, scant VB Extremities - peripheral pulses normal, no pedal edema, no clubbing or cyanosis S/p D&E  Call if bleeding does not stop Plan to f/u with annual   FINAL for [name], [name] ([id number]) Patient Name: [name], [name] Accession #: [id number] DOB: [birth year] Age: 36 Gender: F Client Name [facility] Collected Date: 02/19/2012 Received Date: 02/19/2012 Physician: Samule Ohm Dillard Chart #: MRN # : [id number] Physician cc: Race:W Visit #: [id number] REPORT OF SURGICAL PATHOLOGY FINAL DIAGNOSIS Diagnosis Products of Conception - CHORIONIC VILLI IDENTIFIED. - FETUS WITH FOOT LENGTH OF 0.5 CM, CORRESPONDING TO A GESTATIONAL AGE OF APPROXIMATELY 9 WEEKS. Pecola Leisure MD Pathologist, Electronic Signature (Case signed 02/22/2012) Specimen Gross and Clinical Information Specimen(s) Obtained: Products of Conception Specimen Clinical Information Missed abortion (isv) Gross Received in fixative is an aggregate of pink tan, spongy and membranous tissue with associated blood clot measuring 13 x 8.5 x 2 cm overall. Fetal tissue is identified demonstrating a foot length measuring 0.5 cm which corresponds to an estimated gestational age of approximately 9 weeks. Representative sections are submitted in a single block. (JC:caf 02/19/12) Report signed out from the following location(s) [facility] [address] [place], [place], [place] [address]. CLIA #: [id number], [facility] [address], [place], [place] [address]. CLIA #: [id number], 1 of 1

## 2012-03-28 ENCOUNTER — Telehealth: Payer: Self-pay | Admitting: Obstetrics and Gynecology

## 2012-03-28 NOTE — Telephone Encounter (Signed)
Returned pt's call. LM to return call.   

## 2012-04-01 NOTE — Telephone Encounter (Signed)
Returned pt's call. LM to return call.   

## 2012-04-06 NOTE — Telephone Encounter (Signed)
Returned pt's call. LM to return call.   

## 2012-04-14 NOTE — Telephone Encounter (Signed)
No response from pt. F/U ended. 

## 2013-07-11 ENCOUNTER — Encounter (HOSPITAL_COMMUNITY): Payer: Self-pay | Admitting: Emergency Medicine

## 2013-07-11 ENCOUNTER — Emergency Department (HOSPITAL_COMMUNITY)
Admission: EM | Admit: 2013-07-11 | Discharge: 2013-07-11 | Disposition: A | Discharge status: Home / Self Care | Payer: Self-pay | Source: Home / Self Care | Attending: Family Medicine | Admitting: Family Medicine

## 2013-07-11 DIAGNOSIS — N390 Urinary tract infection, site not specified: Secondary | ICD-10-CM

## 2013-07-11 LAB — POCT URINALYSIS DIP (DEVICE)
Bilirubin Urine: NEGATIVE
Glucose, UA: NEGATIVE mg/dL
Ketones, ur: NEGATIVE mg/dL
Nitrite: NEGATIVE
Protein, ur: 100 mg/dL — AB
Specific Gravity, Urine: 1.015 (ref 1.005–1.030)
Urobilinogen, UA: 0.2 mg/dL (ref 0.0–1.0)
pH: 8.5 — ABNORMAL HIGH (ref 5.0–8.0)

## 2013-07-11 MED ORDER — PHENAZOPYRIDINE HCL 200 MG PO TABS: 200.0000 mg | ORAL_TABLET | Freq: Three times a day (TID) | ORAL | Status: DC

## 2013-07-11 MED ORDER — CEPHALEXIN 500 MG PO CAPS: 1000.0000 mg | ORAL_CAPSULE | Freq: Three times a day (TID) | ORAL | Status: DC

## 2013-07-11 NOTE — ED Provider Notes (Signed)
CSN: [id number]     Arrival date & time 07/11/13  1018 History   First MD Initiated Contact with Patient 07/11/13 1044     Chief Complaint  Patient presents with  . Urinary Tract Infection     (Consider location/radiation/quality/duration/timing/severity/associated sxs/prior Treatment) HPI Comments: 38 year old female presents complaining of possible urinary tract infection. Since this morning, she has gross hematuria, lower abdominal discomfort after urination, and urethral discomfort after urination. She has had a UTI in the past but this was many years ago. She denies any back or flank pain, nausea, vomiting. Denies any recent hospitalization  Patient is a 38 y.o. female presenting with urinary tract infection.  Urinary Tract Infection Associated symptoms include abdominal pain (lower abdominal discomfort). Pertinent negatives include no chest pain and no shortness of breath.    Past Medical History  Diagnosis Date  . SVD (spontaneous vaginal delivery)     x 3  . Reflux   . PONV (postoperative nausea and vomiting)   . Missed abortion 02/19/2012  . History of induced abortion 02/19/2012    EAB x3 w/ G1 ('00), G3 ('05), G4 ('06)  . 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 Asthma 02/19/2012  . History of abnormal Pap smear 02/19/2012    2002; had colpo's and questionable LEEP   Past Surgical History  Procedure Laterality Date  . Wisdom tooth extraction    . Dilation and evacuation  02/19/2012    Procedure: DILATATION AND EVACUATION;  Surgeon: Michael Litter, MD;  Location: [facility];  Service: Gynecology;  Laterality: N/A;   Family History  Problem Relation Age of Onset  . Cancer Maternal Grandmother   . Diabetes Maternal Grandfather    History  Substance Use Topics  . Smoking status: Never Smoker   . Smokeless tobacco: Never Used  . Alcohol Use: No   OB History   Grav Para Term Preterm Abortions TAB SAB Ect Mult Living   7 3 3  3 3    3      Review of Systems  Constitutional:  Negative for fever and chills.  Eyes: Negative for visual disturbance.  Respiratory: Negative for cough and shortness of breath.   Cardiovascular: Negative for chest pain, palpitations and leg swelling.  Gastrointestinal: Positive for abdominal pain (lower abdominal discomfort). Negative for nausea and vomiting.  Endocrine: Negative for polydipsia and polyuria.  Genitourinary: Positive for dysuria, urgency, frequency and hematuria. Negative for flank pain and decreased urine volume.  Musculoskeletal: Negative for arthralgias and myalgias.  Skin: Negative for rash.  Neurological: Negative for dizziness, weakness and light-headedness.      Allergies  Penicillins  Home Medications   Current Outpatient Rx  Name  Route  Sig  Dispense  Refill  . cephALEXin (KEFLEX) 500 MG capsule   Oral   Take 2 capsules (1,000 mg total) by mouth 3 (three) times daily.   30 capsule   0   . doxycycline (VIBRAMYCIN) 50 MG capsule   Oral   Take 2 capsules (100 mg total) by mouth 2 (two) times daily.   10 capsule   0   . HYDROcodone-acetaminophen (VICODIN) 5-500 MG per tablet   Oral   Take 1 tablet by mouth every 6 (six) hours as needed for pain.   30 tablet   0   . ibuprofen (ADVIL,MOTRIN) 600 MG tablet   Oral   Take 1 tablet (600 mg total) by mouth every 6 (six) hours as needed for pain.   30  tablet   0   . phenazopyridine (PYRIDIUM) 200 MG tablet   Oral   Take 1 tablet (200 mg total) by mouth 3 (three) times daily.   6 tablet   0   . Prenatal Vit-Fe Fumarate-FA (PRENATAL MULTIVITAMIN) TABS   Oral   Take 1 tablet by mouth daily.         . ranitidine (ZANTAC) 150 MG capsule   Oral   Take 150 mg by mouth 2 (two) times daily as needed. For bloating/GI upset          BP 120/66  Pulse 87  Temp(Src) 98.6 F (37 C) (Oral)  Resp 20  SpO2 100%  LMP 06/26/2013  Breastfeeding? Unknown Physical Exam  Nursing note and vitals reviewed. Constitutional: She is oriented to person,  place, and time. Vital signs are normal. She appears well-developed and well-nourished. No distress.  HENT:  Head: Normocephalic and atraumatic.  Cardiovascular: Normal rate, regular rhythm and normal heart sounds.  Exam reveals no gallop and no friction rub.   No murmur heard. Pulmonary/Chest: Effort normal and breath sounds normal. No respiratory distress. She has no wheezes. She has no rales.  Abdominal: Soft. Normal appearance. There is tenderness (mild) in the suprapubic area. There is no CVA tenderness.  Neurological: She is alert and oriented to person, place, and time. She has normal strength. Coordination normal.  Skin: Skin is warm and dry. No rash noted. She is not diaphoretic.  Psychiatric: She has a normal mood and affect. Judgment normal.    ED Course  Procedures (including critical care time) Labs Review Labs Reviewed  POCT URINALYSIS DIP (DEVICE) - Abnormal; Notable for the following:    Hgb urine dipstick LARGE (*)    pH 8.5 (*)    Protein, ur 100 (*)    Leukocytes, UA SMALL (*)    All other components within normal limits  URINE CULTURE   Imaging Review No results found.    MDM   Final diagnoses:  UTI (lower urinary tract infection)   Urinalysis indicates urinary tract infection. No symptoms of pyelonephritis. With Keflex, and Pyridium. Followup when necessary   Meds ordered this encounter  Medications  . cephALEXin (KEFLEX) 500 MG capsule    Sig: Take 2 capsules (1,000 mg total) by mouth 3 (three) times daily.    Dispense:  30 capsule    Refill:  0    Order Specific Question:  Supervising Provider    Answer:  Linna HoffKINDL, JAMES D [phone][[id number]]  . phenazopyridine (PYRIDIUM) 200 MG tablet    Sig: Take 1 tablet (200 mg total) by mouth 3 (three) times daily.    Dispense:  6 tablet    Refill:  0    Order Specific Question:  Supervising Provider    Answer:  Bradd CanaryKINDL, JAMES D [[id number]]     Graylon GoodZachary H Kayliana Codd, PA-C 07/11/13 1118

## 2013-07-11 NOTE — ED Notes (Signed)
C/o waking at 3 a.m and noticing blood in urine.  Urine was cloudy and had a funny odor.  Frequency/urgency.  Pressure, no pain.   Tingling/spasming sensation after urinating.   No otc meds taken for symptoms.

## 2013-07-11 NOTE — Discharge Instructions (Signed)
Urinary Tract Infection  Urinary tract infections (UTIs) can develop anywhere along your urinary tract. Your urinary tract is your body's drainage system for removing wastes and extra water. Your urinary tract includes two kidneys, two ureters, a bladder, and a urethra. Your kidneys are a pair of bean-shaped organs. Each kidney is about the size of your fist. They are located below your ribs, one on each side of your spine.  CAUSES  Infections are caused by microbes, which are microscopic organisms, including fungi, viruses, and bacteria. These organisms are so small that they can only be seen through a microscope. Bacteria are the microbes that most commonly cause UTIs.  SYMPTOMS   Symptoms of UTIs may vary by age and gender of the patient and by the location of the infection. Symptoms in young women typically include a frequent and intense urge to urinate and a painful, burning feeling in the bladder or urethra during urination. Older women and men are more likely to be tired, shaky, and weak and have muscle aches and abdominal pain. A fever may mean the infection is in your kidneys. Other symptoms of a kidney infection include pain in your back or sides below the ribs, nausea, and vomiting.  DIAGNOSIS  To diagnose a UTI, your caregiver will ask you about your symptoms. Your caregiver also will ask to provide a urine sample. The urine sample will be tested for bacteria and white blood cells. White blood cells are made by your body to help fight infection.  TREATMENT   Typically, UTIs can be treated with medication. Because most UTIs are caused by a bacterial infection, they usually can be treated with the use of antibiotics. The choice of antibiotic and length of treatment depend on your symptoms and the type of bacteria causing your infection.  HOME CARE INSTRUCTIONS   If you were prescribed antibiotics, take them exactly as your caregiver instructs you. Finish the medication even if you feel better after you  have only taken some of the medication.   Drink enough water and fluids to keep your urine clear or pale yellow.   Avoid caffeine, tea, and carbonated beverages. They tend to irritate your bladder.   Empty your bladder often. Avoid holding urine for long periods of time.   Empty your bladder before and after sexual intercourse.   After a bowel movement, women should cleanse from front to back. Use each tissue only once.  SEEK MEDICAL CARE IF:    You have back pain.   You develop a fever.   Your symptoms do not begin to resolve within 3 days.  SEEK IMMEDIATE MEDICAL CARE IF:    You have severe back pain or lower abdominal pain.   You develop chills.   You have nausea or vomiting.   You have continued burning or discomfort with urination.  MAKE SURE YOU:    Understand these instructions.   Will watch your condition.   Will get help right away if you are not doing well or get worse.  Document Released: 02/25/2005 Document Revised: 11/17/2011 Document Reviewed: 06/26/2011  ExitCare Patient Information 2014 [employer].

## 2013-07-12 NOTE — ED Provider Notes (Signed)
Medical screening examination/treatment/procedure(s) were performed by resident physician or non-physician practitioner and as supervising physician I was immediately available for consultation/collaboration.   Alizia Greif DOUGLAS MD.   Ayeisha Lindenberger D Cederick Broadnax, MD 07/12/13 1957 

## 2013-07-13 LAB — URINE CULTURE: Colony Count: >=100000

## 2013-07-13 NOTE — ED Notes (Signed)
Urine culture: >100,000 colonies E. Coli.  Pt. adequately treated with Keflex. Vassie MoselleYork, Louie Meaders M 07/13/2013

## 2013-07-26 ENCOUNTER — Emergency Department (HOSPITAL_COMMUNITY): Payer: Self-pay

## 2013-07-26 ENCOUNTER — Emergency Department (INDEPENDENT_AMBULATORY_CARE_PROVIDER_SITE_OTHER)
Admission: EM | Admit: 2013-07-26 | Discharge: 2013-07-26 | Disposition: A | Discharge status: Home / Self Care | Payer: Self-pay | Source: Home / Self Care | Attending: Family Medicine | Admitting: Family Medicine

## 2013-07-26 ENCOUNTER — Encounter (HOSPITAL_COMMUNITY): Payer: Self-pay | Admitting: Emergency Medicine

## 2013-07-26 ENCOUNTER — Emergency Department (INDEPENDENT_AMBULATORY_CARE_PROVIDER_SITE_OTHER): Payer: Self-pay

## 2013-07-26 DIAGNOSIS — Z23 Encounter for immunization: Secondary | ICD-10-CM

## 2013-07-26 DIAGNOSIS — M542 Cervicalgia: Secondary | ICD-10-CM

## 2013-07-26 DIAGNOSIS — W19XXXA Unspecified fall, initial encounter: Secondary | ICD-10-CM

## 2013-07-26 DIAGNOSIS — S0121XA Laceration without foreign body of nose, initial encounter: Secondary | ICD-10-CM

## 2013-07-26 DIAGNOSIS — S0120XA Unspecified open wound of nose, initial encounter: Secondary | ICD-10-CM

## 2013-07-26 MED ORDER — TETANUS-DIPHTH-ACELL PERTUSSIS 5-2.5-18.5 LF-MCG/0.5 IM SUSP
0.5000 mL | Freq: Once | INTRAMUSCULAR | Status: AC
  Administered 2013-07-26: 0.5 mL via INTRAMUSCULAR

## 2013-07-26 MED ORDER — TETANUS-DIPHTH-ACELL PERTUSSIS 5-2.5-18.5 LF-MCG/0.5 IM SUSP
INTRAMUSCULAR | Status: AC
  Filled 2013-07-26: qty 0.5

## 2013-07-26 MED ORDER — DICLOFENAC SODIUM 75 MG PO TBEC
75.0000 mg | DELAYED_RELEASE_TABLET | Freq: Two times a day (BID) | ORAL | Status: DC | PRN
Start: 2013-07-26 — End: 2014-08-15

## 2013-07-26 MED ORDER — HYDROCODONE-ACETAMINOPHEN 5-325 MG PO TABS: 1.0000 | ORAL_TABLET | Freq: Four times a day (QID) | ORAL | Status: DC | PRN

## 2013-07-26 NOTE — ED Notes (Signed)
Called to assess pt.  Pt. states she slipped in bathtub and hit her head on tub and nose on soap holder approx. 1 hr ago.  No LOC.  Consc and alert and amb.  Was nauseated initially but not now.  Has abrasion to bridge of nose and small red bump to L forehead.  Both are tender.  C/o feeling tired.  Cheeks are flushed.  T 99.1. States she has had a cold and runny nose- taking Mucinex.

## 2013-07-26 NOTE — ED Provider Notes (Signed)
[name] is a 38 y.o. female who presents to Urgent Care today for fall.  Patient fell today in the shower due to mechanical cause. She hit her face against a soap dish. She notes a nose laceration and contusion as well as neck stiffness and pain. She denies any loss of consciousness fogginess residence for severe headache. No nausea vomiting or diarrhea weakness or numbness. She feels well otherwise. She has not tried any medications yet.   Past Medical History  Diagnosis Date  . SVD (spontaneous vaginal delivery)     x 3  . Reflux   . PONV (postoperative nausea and vomiting)   . Missed abortion 02/19/2012  . History of induced abortion 02/19/2012    EAB x3 w/ G1 ('00), G3 ('05), G4 ('06)  . 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 Asthma 02/19/2012  . History of abnormal Pap smear 02/19/2012    2002; had colpo's and questionable LEEP   History  Substance Use Topics  . Smoking status: Never Smoker   . Smokeless tobacco: Never Used  . Alcohol Use: No   ROS as above Medications: Current Facility-Administered Medications  Medication Dose Route Frequency Provider Last Rate Last Dose  . Tdap (BOOSTRIX) injection 0.5 mL  0.5 mL Intramuscular Once Rodolph Bong, MD       Current Outpatient Prescriptions  Medication Sig Dispense Refill  . diclofenac (VOLTAREN) 75 MG EC tablet Take 1 tablet (75 mg total) by mouth 2 (two) times daily as needed.  60 tablet  0  . HYDROcodone-acetaminophen (NORCO/VICODIN) 5-325 MG per tablet Take 1 tablet by mouth every 6 (six) hours as needed.  15 tablet  0  . phenazopyridine (PYRIDIUM) 200 MG tablet Take 1 tablet (200 mg total) by mouth 3 (three) times daily.  6 tablet  0  . Prenatal Vit-Fe Fumarate-FA (PRENATAL MULTIVITAMIN) TABS Take 1 tablet by mouth daily.      . ranitidine (ZANTAC) 150 MG capsule Take 150 mg by mouth 2 (two) times daily as needed. For bloating/GI upset        Exam:  BP 116/54  Pulse 94  Temp(Src) 99.1 F (37.3 C) (Oral)  Resp 20   SpO2 98%  LMP 06/26/2013 Gen: Well NAD HEENT: EOMI,  MMM nose contusion and laceration across the bridge of her nose. Approximately 1 cm extending through the dermis. Pain-free EOMI. Lungs: Normal work of breathing. CTABL Heart: RRR no MRG Abd: NABS, Soft. NT, ND Exts: Brisk capillary refill, warm and well perfused.  Neck is nontender to spinal midline. Tender palpation bilateral cervical paraspinals and trapezius. Decreased range of motion Upper extremity strength and motion are intact; sensation is intact distally Neuro: Alert and oriented normally conversant. Normal memory and attention. Normal speech pattern.  Normal gait and balance  Laceration repair nose: Consent obtained and timeout performed Area cleaned 1 mL of 2% lidocaine without epinephrine injected into the wound The wound was copiously area of sterile saline The surrounding skin was prepped and draped in the usual sterile fashion.  3 simple interrupted sutures using 5-0 Prolene were used to close the wound. Patient tolerated the procedure well  No results found for this or any previous visit (from the past 24 hour(s)). Dg Nasal Bones  07/26/2013   CLINICAL DATA:  38 year old female with nose injury and pain.  EXAM: NASAL BONES - 3+ VIEW  COMPARISON:  None.  FINDINGS: There is no evidence of acute fracture or other bone abnormality.  IMPRESSION:  Negative.   Electronically Signed   By: Laveda AbbeJeff  Hu M.D.   On: 07/26/2013 21:19   Dg Cervical Spine Complete  07/26/2013   CLINICAL DATA:  38 year old female with neck pain.  EXAM: CERVICAL SPINE  4+ VIEWS  COMPARISON:  None.  FINDINGS: Normal alignment is noted.  There is no evidence of acute fracture, subluxation or prevertebral soft tissue swelling.  Mild degenerative disc disease at C6-C7 noted.  There is no evidence of bony foraminal narrowing.  No focal bony lesions are present.  IMPRESSION: No static evidence of acute injury to the cervical spine.   Electronically Signed   By:  Laveda AbbeJeff  Hu M.D.   On: 07/26/2013 21:42    Assessment and Plan: 38 y.o. female with nose contusion and laceration. Additionally patient has cervical neck pain. Plan to treat pain with diclofenac and Norco. Additionally he is eating pattern home exercise program. Followup if not improving. Return in one week for suture removal.  Discussed warning signs or symptoms. Please see discharge instructions. Patient expresses understanding.    Rodolph BongEvan S Niamya Vittitow, MD 07/26/13 [phone]

## 2013-07-26 NOTE — Discharge Instructions (Signed)
Thank you for coming in today. Take diclofenac twice daily for pain as needed. Use Norco for severe pain. Use a heating pad on your neck. Come back or go to the emergency room if you notice new weakness new numbness problems walking or bowel or bladder problems.

## 2014-04-02 ENCOUNTER — Encounter (HOSPITAL_COMMUNITY): Payer: Self-pay | Admitting: Emergency Medicine

## 2014-08-15 ENCOUNTER — Inpatient Hospital Stay (HOSPITAL_COMMUNITY)
Admission: AD | Admit: 2014-08-15 | Discharge: 2014-08-15 | Disposition: A | Discharge status: Home / Self Care | Payer: Medicaid Other | Source: Ambulatory Visit | Attending: Obstetrics & Gynecology | Admitting: Obstetrics & Gynecology

## 2014-08-15 ENCOUNTER — Encounter (HOSPITAL_COMMUNITY): Payer: Self-pay | Admitting: *Deleted

## 2014-08-15 DIAGNOSIS — O9A211 Injury, poisoning and certain other consequences of external causes complicating pregnancy, first trimester: Secondary | ICD-10-CM

## 2014-08-15 DIAGNOSIS — S3991XA Unspecified injury of abdomen, initial encounter: Secondary | ICD-10-CM | POA: Diagnosis not present

## 2014-08-15 DIAGNOSIS — M545 Low back pain, unspecified: Secondary | ICD-10-CM

## 2014-08-15 DIAGNOSIS — Z041 Encounter for examination and observation following transport accident: Secondary | ICD-10-CM

## 2014-08-15 DIAGNOSIS — O9989 Other specified diseases and conditions complicating pregnancy, childbirth and the puerperium: Secondary | ICD-10-CM | POA: Insufficient documentation

## 2014-08-15 DIAGNOSIS — G8911 Acute pain due to trauma: Secondary | ICD-10-CM | POA: Diagnosis not present

## 2014-08-15 DIAGNOSIS — Z3A01 Less than 8 weeks gestation of pregnancy: Secondary | ICD-10-CM | POA: Insufficient documentation

## 2014-08-15 LAB — URINALYSIS, ROUTINE W REFLEX MICROSCOPIC
Bilirubin Urine: NEGATIVE
Glucose, UA: NEGATIVE mg/dL
Ketones, ur: 15 mg/dL — AB
Leukocytes, UA: NEGATIVE
Nitrite: NEGATIVE
Protein, ur: NEGATIVE mg/dL
Specific Gravity, Urine: 1.02 (ref 1.005–1.030)
Urobilinogen, UA: 0.2 mg/dL (ref 0.0–1.0)
pH: 6 (ref 5.0–8.0)

## 2014-08-15 LAB — URINE MICROSCOPIC-ADD ON

## 2014-08-15 LAB — POCT PREGNANCY, URINE: Preg Test, Ur: POSITIVE — AB

## 2014-08-15 MED ORDER — CYCLOBENZAPRINE HCL 5 MG PO TABS: 5.0000 mg | ORAL_TABLET | Freq: Three times a day (TID) | ORAL | Status: DC | PRN

## 2014-08-15 NOTE — Discharge Instructions (Signed)
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
## 2014-08-16 ENCOUNTER — Encounter (HOSPITAL_COMMUNITY): Payer: Self-pay | Admitting: *Deleted

## 2014-08-16 ENCOUNTER — Inpatient Hospital Stay (HOSPITAL_COMMUNITY)
Admission: AD | Admit: 2014-08-16 | Discharge: 2014-08-16 | Disposition: A | Discharge status: Home / Self Care | Payer: Medicaid Other | Source: Ambulatory Visit | Attending: Family Medicine | Admitting: Family Medicine

## 2014-08-16 ENCOUNTER — Inpatient Hospital Stay (HOSPITAL_COMMUNITY): Payer: Medicaid Other

## 2014-08-16 DIAGNOSIS — O209 Hemorrhage in early pregnancy, unspecified: Secondary | ICD-10-CM | POA: Diagnosis not present

## 2014-08-16 DIAGNOSIS — Z3A01 Less than 8 weeks gestation of pregnancy: Secondary | ICD-10-CM | POA: Diagnosis not present

## 2014-08-16 DIAGNOSIS — O26851 Spotting complicating pregnancy, first trimester: Secondary | ICD-10-CM | POA: Diagnosis present

## 2014-08-16 LAB — WET PREP, GENITAL
Clue Cells Wet Prep HPF POC: NONE SEEN
Trich, Wet Prep: NONE SEEN
Yeast Wet Prep HPF POC: NONE SEEN

## 2014-08-16 LAB — CBC
HCT: 41.8 % (ref 36.0–46.0)
Hemoglobin: 14.5 g/dL (ref 12.0–15.0)
MCH: 31.7 pg (ref 26.0–34.0)
MCHC: 34.7 g/dL (ref 30.0–36.0)
MCV: 91.3 fL (ref 78.0–100.0)
Platelets: 269 10*3/uL (ref 150–400)
RBC: 4.58 MIL/uL (ref 3.87–5.11)
RDW: 13.2 % (ref 11.5–15.5)
WBC: 3.2 10*3/uL — ABNORMAL LOW (ref 4.0–10.5)

## 2014-08-16 LAB — HCG, QUANTITATIVE, PREGNANCY: hCG, Beta Chain, Quant, S: 380 m[IU]/mL — ABNORMAL HIGH (ref <5)
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
## 2014-08-16 NOTE — Discharge Instructions (Signed)
Vaginal Bleeding During Pregnancy, First Trimester  A small amount of bleeding (spotting) from the vagina is relatively common in early pregnancy. It usually stops on its own. Various things may cause bleeding or spotting in early pregnancy. Some bleeding may be related to the pregnancy, and some may not. In most cases, the bleeding is normal and is not a problem. However, bleeding can also be a sign of something serious. Be sure to tell your health care provider about any vaginal bleeding right away.  Some possible causes of vaginal bleeding during the first trimester include:  · Infection or inflammation of the cervix.  · Growths (polyps) on the cervix.  · Miscarriage or threatened miscarriage.  · Pregnancy tissue has developed outside of the uterus and in a fallopian tube (tubal pregnancy).  · Tiny cysts have developed in the uterus instead of pregnancy tissue (molar pregnancy).  HOME CARE INSTRUCTIONS   Watch your condition for any changes. The following actions may help to lessen any discomfort you are feeling:  · Follow your health care provider's instructions for limiting your activity. If your health care provider orders bed rest, you may need to stay in bed and only get up to use the bathroom. However, your health care provider may allow you to continue light activity.  · If needed, make plans for someone to help with your regular activities and responsibilities while you are on bed rest.  · Keep track of the number of pads you use each day, how often you change pads, and how soaked (saturated) they are. Write this down.  · Do not use tampons. Do not douche.  · Do not have sexual intercourse or orgasms until approved by your health care provider.  · If you pass any tissue from your vagina, save the tissue so you can show it to your health care provider.  · Only take over-the-counter or prescription medicines as directed by your health care provider.  · Do not take aspirin because it can make you  bleed.  · Keep all follow-up appointments as directed by your health care provider.  SEEK MEDICAL CARE IF:  · You have any vaginal bleeding during any part of your pregnancy.  · You have cramps or labor pains.  · You have a fever, not controlled by medicine.  SEEK IMMEDIATE MEDICAL CARE IF:   · You have severe cramps in your back or belly (abdomen).  · You pass large clots or tissue from your vagina.  · Your bleeding increases.  · You feel light-headed or weak, or you have fainting episodes.  · You have chills.  · You are leaking fluid or have a gush of fluid from your vagina.  · You pass out while having a bowel movement.  MAKE SURE YOU:  · Understand these instructions.  · Will watch your condition.  · Will get help right away if you are not doing well or get worse.  Document Released: 02/25/2005 Document Revised: 05/23/2013 Document Reviewed: 01/23/2013  ExitCare® Patient Information ©2015 [employer]. This information is not intended to replace advice given to you by your health care provider. Make sure you discuss any questions you have with your health care provider.

## 2014-08-16 NOTE — MAU Note (Signed)
Urine in lab 

## 2014-08-17 LAB — HIV ANTIBODY (ROUTINE TESTING W REFLEX): HIV Screen 4th Generation wRfx: NONREACTIVE

## 2014-08-17 LAB — GC/CHLAMYDIA PROBE AMP (~~LOC~~) NOT AT ARMC
Chlamydia: NEGATIVE
Neisseria Gonorrhea: NEGATIVE

## 2014-08-19 ENCOUNTER — Inpatient Hospital Stay (HOSPITAL_COMMUNITY)
Admission: AD | Admit: 2014-08-19 | Discharge: 2014-08-19 | Disposition: A | Discharge status: Home / Self Care | Payer: Medicaid Other | Source: Ambulatory Visit | Attending: Obstetrics and Gynecology | Admitting: Obstetrics and Gynecology

## 2014-08-19 DIAGNOSIS — O039 Complete or unspecified spontaneous abortion without complication: Secondary | ICD-10-CM

## 2014-08-19 LAB — HCG, QUANTITATIVE, PREGNANCY: hCG, Beta Chain, Quant, S: 56 m[IU]/mL — ABNORMAL HIGH (ref <5)

## 2014-08-19 NOTE — Discharge Instructions (Signed)
Miscarriage A miscarriage is the sudden loss of an unborn baby (fetus) before the 20th week of pregnancy. Most miscarriages happen in the first 3 months of pregnancy. Sometimes, it happens before a woman even knows she is pregnant. A miscarriage is also called a "spontaneous miscarriage" or "early pregnancy loss." Having a miscarriage can be an emotional experience. Talk with your caregiver about any questions you may have about miscarrying, the grieving process, and your future pregnancy plans. CAUSES   Problems with the fetal chromosomes that make it impossible for the baby to develop normally. Problems with the baby's genes or chromosomes are most often the result of errors that occur, by chance, as the embryo divides and grows. The problems are not inherited from the parents.  Infection of the cervix or uterus.   Hormone problems.   Problems with the cervix, such as having an incompetent cervix. This is when the tissue in the cervix is not strong enough to hold the pregnancy.   Problems with the uterus, such as an abnormally shaped uterus, uterine fibroids, or congenital abnormalities.   Certain medical conditions.   Smoking, drinking alcohol, or taking illegal drugs.   Trauma.  Often, the cause of a miscarriage is unknown.  SYMPTOMS   Vaginal bleeding or spotting, with or without cramps or pain.  Pain or cramping in the abdomen or lower back.  Passing fluid, tissue, or blood clots from the vagina. DIAGNOSIS  Your caregiver will perform a physical exam. You may also have an ultrasound to confirm the miscarriage. Blood or urine tests may also be ordered. TREATMENT   Sometimes, treatment is not necessary if you naturally pass all the fetal tissue that was in the uterus. If some of the fetus or placenta remains in the body (incomplete miscarriage), tissue left behind may become infected and must be removed. Usually, a dilation and curettage (D and C) procedure is performed.  During a D and C procedure, the cervix is widened (dilated) and any remaining fetal or placental tissue is gently removed from the uterus.  Antibiotic medicines are prescribed if there is an infection. Other medicines may be given to reduce the size of the uterus (contract) if there is a lot of bleeding.  If you have Rh negative blood and your baby was Rh positive, you will need a Rh immunoglobulin shot. This shot will protect any future baby from having Rh blood problems in future pregnancies. HOME CARE INSTRUCTIONS   Your caregiver may order bed rest or may allow you to continue light activity. Resume activity as directed by your caregiver.  Have someone help with home and family responsibilities during this time.   Keep track of the number of sanitary pads you use each day and how soaked (saturated) they are. Write down this information.   Do not use tampons. Do not douche or have sexual intercourse until approved by your caregiver.   Only take over-the-counter or prescription medicines for pain or discomfort as directed by your caregiver.   Do not take aspirin. Aspirin can cause bleeding.   Keep all follow-up appointments with your caregiver.   If you or your partner have problems with grieving, talk to your caregiver or seek counseling to help cope with the pregnancy loss. Allow enough time to grieve before trying to get pregnant again.  SEEK IMMEDIATE MEDICAL CARE IF:   You have severe cramps or pain in your back or abdomen.  You have a fever.  You pass large blood clots (walnut-sized   or larger) ortissue from your vagina. Save any tissue for your caregiver to inspect.   Your bleeding increases.   You have a thick, bad-smelling vaginal discharge.  You become lightheaded, weak, or you faint.   You have chills.  MAKE SURE YOU:  Understand these instructions.  Will watch your condition.  Will get help right away if you are not doing well or get  worse. Document Released: 11/11/2000 Document Revised: 09/12/2012 Document Reviewed: 07/07/2011 ExitCare Patient Information 2015 [employer]. This information is not intended to replace advice given to you by your health care provider. Make sure you discuss any questions you have with your health care provider.  

## 2014-08-19 NOTE — MAU Provider Note (Signed)
History     CSN: [id number]  Arrival date and time: 08/19/14 1105   None     Chief Complaint  Patient presents with  . Labs Only   HPI This is a 39 y.o. at 5690w2d by LMP who presents for followup HCG level. She was seen by me 3 days ago for bleeding and cramping. Quant was low. Since then she passed a golfball sized clump of white tissue. Bleeding now is just small.   RN Note:  Expand All Collapse All   Pt presents to MAU for repeat BHCG. Reports small amount of vaginal bleeding, denies any abdominal cramping          OB History    Gravida Para Term Preterm AB TAB SAB Ectopic Multiple Living   9 3 3  4 3 1   3       Past Medical History  Diagnosis Date  . SVD (spontaneous vaginal delivery)     x 3  . Reflux   . PONV (postoperative nausea and vomiting)   . Missed abortion 02/19/2012  . History of induced abortion 02/19/2012    EAB x3 w/ G1 ('00), G3 ('05), G4 ('06)  . 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 Asthma 02/19/2012  . History of abnormal Pap smear 02/19/2012    2002; had colpo's and questionable LEEP    Past Surgical History  Procedure Laterality Date  . Wisdom tooth extraction    . Dilation and evacuation  02/19/2012    Procedure: DILATATION AND EVACUATION;  Surgeon: Michael LitterNaima A Dillard, MD;  Location: [facility];  Service: Gynecology;  Laterality: N/A;    Family History  Problem Relation Age of Onset  . Cancer Maternal Grandmother   . Diabetes Maternal Grandfather     History  Substance Use Topics  . Smoking status: Never Smoker   . Smokeless tobacco: Never Used  . Alcohol Use: No    Allergies:  Allergies  Allergen Reactions  . Penicillins Hives  . Amoxil [Amoxicillin] Rash    Prescriptions prior to admission  Medication Sig Dispense Refill Last Dose  . cetirizine (ZYRTEC) 10 MG tablet Take 10 mg by mouth daily as needed for allergies.   08/15/2014 at Unknown time  . cyclobenzaprine (FLEXERIL) 5 MG tablet Take 1 tablet (5 mg total) by mouth 3 (three)  times daily as needed for muscle spasms. (Patient not taking: Reported on 08/16/2014) 10 tablet 0 prn  . Prenatal Vit-Min-FA-Fish Oil ([employer] PRENATAL GUMMY PO) Take 2 each by mouth daily.   08/15/2014 at Unknown time    Review of Systems  Constitutional: Negative for fever, chills and malaise/fatigue.  Gastrointestinal: Negative for nausea, vomiting and abdominal pain.  Genitourinary:       Scant bleeding   Neurological: Negative for dizziness.   Physical Exam   Blood pressure 98/64, pulse 68, last menstrual period 07/06/2014, unknown if currently breastfeeding.  Physical Exam  Constitutional: She is oriented to person, place, and time. She appears well-developed and well-nourished. No distress.  HENT:  Head: Normocephalic.  Cardiovascular: Normal rate.   Respiratory: Effort normal.  GI: Soft. There is no tenderness.  Genitourinary:  Exam not indicated  Musculoskeletal: Normal range of motion.  Neurological: She is alert and oriented to person, place, and time.  Skin: Skin is warm and dry.  Psychiatric: She has a normal mood and affect.  Tearful     MAU Course  Procedures  MDM Results for orders placed or performed during the  hospital encounter of 08/19/14 (from the past 24 hour(s))  hCG, quantitative, pregnancy     Status: Abnormal   Collection Time: 08/19/14 11:25 AM  Result Value Ref Range   hCG, Beta Chain, Quant, S 56 (H) <5 mIU/mL   Results for LAURIANA, DENES (MRN [id number]) as of 08/19/2014 15:53  Ref. Range 08/16/2014 08:55  hCG, Beta Chain, Quant, S Latest Range: <5 mIU/mL 380 (H)    Assessment and Plan  A:  Pregnancy, SAB       Decreasing hcg levels  P:  Discussed findings       Tearful, support given        Followup Friday in clinic for last quant HCG level  [facility] 08/19/2014, 1:11 PM

## 2014-08-19 NOTE — MAU Note (Signed)
Pt presents to MAU for repeat BHCG. Reports small amount of vaginal bleeding, denies any abdominal cramping

## 2014-08-23 ENCOUNTER — Encounter: Payer: Self-pay | Admitting: *Deleted

## 2014-08-23 ENCOUNTER — Other Ambulatory Visit: Payer: Self-pay

## 2014-08-23 DIAGNOSIS — O039 Complete or unspecified spontaneous abortion without complication: Secondary | ICD-10-CM

## 2014-08-23 NOTE — Progress Notes (Unsigned)
Pt denies pain or bleeding, here for followup beta hcg. Advised patient that results will be back Monday as we are closed tomorrow. Patient states understanding and will go to MAU if she develops any heavy bleeding or pain.

## 2014-08-24 LAB — HCG, QUANTITATIVE, PREGNANCY: hCG, Beta Chain, Quant, S: 7.6 m[IU]/mL

## 2014-08-28 ENCOUNTER — Encounter: Payer: Self-pay | Admitting: General Practice

## 2014-08-28 ENCOUNTER — Telehealth: Payer: Self-pay | Admitting: General Practice

## 2014-08-28 ENCOUNTER — Encounter: Payer: Self-pay | Admitting: Obstetrics & Gynecology

## 2014-08-28 NOTE — Telephone Encounter (Signed)
Patient came by front office requesting results and letter for work. Informed patient of results. Patient verbalized understanding and states that she has still been out of work because she did not have her results and she didn't know if it was safe to return to work and if she still had restrictions or not. Spoke to [place] Smith who states patient to have follow up bhcg on 3/31 to ensure levels to 0 and have follow up appt in 2-3 weeks with provider. [place] Katrinka BlazingSmith also stated patient can return to work tomorrow with no restrictions. Gave patient letter for work and discussed plan of care. Patient satisfied, verbalized understanding to all and had no other questions.

## 2014-08-28 NOTE — Telephone Encounter (Signed)
-----   Message from Reva Boresanya S Pratt, MD sent at 08/28/2014  8:30 AM EDT -----   ----- Message -----    From: Henri MedalJazmin M Hartsell, CMA    Sent: 08/27/2014   9:44 AM      To: Reva Boresanya S Pratt, MD  Patient is not established in our clinic.  We are taking new patients but there is a waiting list of 2-3 months.  Jazmin Hartsell,CMA  ----- Message -----    From: Reva Boresanya S Pratt, MD    Sent: 08/24/2014   9:31 AM      To: [facility]  Pt. With falling BHCG--c/w miscarriage--f/u clinic in 3 wks or so

## 2014-08-28 NOTE — Telephone Encounter (Signed)
Called patient, no answer- left message stating we are trying to reach you with non urgent results, please call us back at the clinics. Patient will need follow up in clinic

## 2014-08-30 ENCOUNTER — Other Ambulatory Visit: Payer: Self-pay

## 2014-09-08 ENCOUNTER — Inpatient Hospital Stay (HOSPITAL_COMMUNITY)
Admission: AD | Admit: 2014-09-08 | Discharge: 2014-09-08 | Disposition: A | Discharge status: Home / Self Care | Payer: Medicaid Other | Source: Ambulatory Visit | Attending: Family Medicine | Admitting: Family Medicine

## 2014-09-08 ENCOUNTER — Encounter (HOSPITAL_COMMUNITY): Payer: Self-pay | Admitting: *Deleted

## 2014-09-08 DIAGNOSIS — O98811 Other maternal infectious and parasitic diseases complicating pregnancy, first trimester: Secondary | ICD-10-CM | POA: Diagnosis not present

## 2014-09-08 DIAGNOSIS — B373 Candidiasis of vulva and vagina: Secondary | ICD-10-CM

## 2014-09-08 DIAGNOSIS — B3731 Acute candidiasis of vulva and vagina: Secondary | ICD-10-CM

## 2014-09-08 DIAGNOSIS — Z880 Allergy status to penicillin: Secondary | ICD-10-CM | POA: Insufficient documentation

## 2014-09-08 DIAGNOSIS — Z3A09 9 weeks gestation of pregnancy: Secondary | ICD-10-CM | POA: Insufficient documentation

## 2014-09-08 DIAGNOSIS — O2341 Unspecified infection of urinary tract in pregnancy, first trimester: Secondary | ICD-10-CM | POA: Diagnosis not present

## 2014-09-08 DIAGNOSIS — N390 Urinary tract infection, site not specified: Secondary | ICD-10-CM

## 2014-09-08 DIAGNOSIS — R102 Pelvic and perineal pain: Secondary | ICD-10-CM | POA: Diagnosis present

## 2014-09-08 LAB — URINALYSIS, ROUTINE W REFLEX MICROSCOPIC
Bilirubin Urine: NEGATIVE
Glucose, UA: NEGATIVE mg/dL
Ketones, ur: NEGATIVE mg/dL
Nitrite: POSITIVE — AB
Protein, ur: 100 mg/dL — AB
Specific Gravity, Urine: 1.02 (ref 1.005–1.030)
Urobilinogen, UA: 0.2 mg/dL (ref 0.0–1.0)
pH: 7 (ref 5.0–8.0)

## 2014-09-08 LAB — WET PREP, GENITAL
Clue Cells Wet Prep HPF POC: NONE SEEN
Trich, Wet Prep: NONE SEEN
Yeast Wet Prep HPF POC: NONE SEEN

## 2014-09-08 LAB — POCT PREGNANCY, URINE: Preg Test, Ur: NEGATIVE

## 2014-09-08 LAB — URINE MICROSCOPIC-ADD ON

## 2014-09-08 MED ORDER — PHENAZOPYRIDINE HCL 100 MG PO TABS
200.0000 mg | ORAL_TABLET | Freq: Once | ORAL | Status: AC
  Administered 2014-09-08: 200 mg via ORAL
  Filled 2014-09-08: qty 2

## 2014-09-08 MED ORDER — PHENAZOPYRIDINE HCL 200 MG PO TABS: 200.0000 mg | ORAL_TABLET | Freq: Three times a day (TID) | ORAL | Status: DC | PRN

## 2014-09-08 MED ORDER — FLUCONAZOLE 150 MG PO TABS: ORAL_TABLET | ORAL | Status: DC

## 2014-09-08 MED ORDER — SULFAMETHOXAZOLE-TRIMETHOPRIM 800-160 MG PO TABS
1.0000 | ORAL_TABLET | Freq: Once | ORAL | Status: AC
  Administered 2014-09-08: 1 via ORAL
  Filled 2014-09-08: qty 1

## 2014-09-08 MED ORDER — SULFAMETHOXAZOLE-TRIMETHOPRIM 800-160 MG PO TABS: 1.0000 | ORAL_TABLET | Freq: Two times a day (BID) | ORAL | Status: DC

## 2014-09-08 NOTE — Progress Notes (Signed)
Written and verbal d/c instructions given and understanding voiced. Understands she will stay about 20 mins after meds given before d/c home

## 2014-09-08 NOTE — Progress Notes (Signed)
Wet prep collected and sent.  

## 2014-09-08 NOTE — MAU Note (Addendum)
Has SAB 3wks ago. Having symptoms of yeast infection. [facility] after [facility]. About 15mins ago went to [facility] and had blood in urine. Last time that happened I had uti and came on quickly. Passed pregnancy on her own and has f/u with Wynelle BourgeoisMarie Williams CNM this coming Thurs.

## 2014-09-08 NOTE — MAU Provider Note (Signed)
Chief Complaint: Urinary Tract Infection   First Provider Initiated Contact with Patient 09/08/14 0153      SUBJECTIVE HPI: [name] is a 39 y.o. [id number] at [redacted]w[redacted]d by LMP who presents to maternity admissions reporting pelvic pressure with urinating, blood in urine, and vaginal itching.  Vaginal itching started a few days ago, then the pelvic pressure and blood in urine was noted on her void at 11 pm before going to bed.  She came in to MAU because a few years ago she had a UTI and did not know how severe it was until she saw blood in her urine.  She was recently seen for SAB with appropriate drop in hcg and has follow up visit scheduled next week with Wynelle Bourgeois, CNM.  She reports some increased white vaginal discharge and mild itching, but denies odor.  She denies back pain, vaginal bleeding, h/a, dizziness, n/v, or fever/chills.    Past Medical History  Diagnosis Date  . SVD (spontaneous vaginal delivery)     x 3  . Reflux   . PONV (postoperative nausea and vomiting)   . Missed abortion 02/19/2012  . History of induced abortion 02/19/2012    EAB x3 w/ G1 ('00), G3 ('05), G4 ('06)  . 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 Asthma 02/19/2012  . History of abnormal Pap smear 02/19/2012    2002; had colpo's and questionable LEEP   Past Surgical History  Procedure Laterality Date  . Wisdom tooth extraction    . Dilation and evacuation  02/19/2012    Procedure: DILATATION AND EVACUATION;  Surgeon: Michael Litter, MD;  Location: [facility];  Service: Gynecology;  Laterality: N/A;   History   Social History  . Marital Status: Single    Spouse Name: N/A  . Number of Children: N/A  . Years of Education: N/A   Occupational History  . Not on file.   Social History Main Topics  . Smoking status: Never Smoker   . Smokeless tobacco: Never Used  . Alcohol Use: No  . Drug Use: No  . Sexual Activity: Yes    Birth Control/ Protection: None   Other Topics Concern  . Not on file   Social  History Narrative   No current facility-administered medications on file prior to encounter.   Current Outpatient Prescriptions on File Prior to Encounter  Medication Sig Dispense Refill  . cetirizine (ZYRTEC) 10 MG tablet Take 10 mg by mouth daily as needed for allergies.    . Prenatal Vit-Min-FA-Fish Oil ([facility] PRENATAL GUMMY PO) Take 2 each by mouth daily.    . cyclobenzaprine (FLEXERIL) 5 MG tablet Take 1 tablet (5 mg total) by mouth 3 (three) times daily as needed for muscle spasms. (Patient not taking: Reported on 08/16/2014) 10 tablet 0   Allergies  Allergen Reactions  . Penicillins Hives  . Amoxil [Amoxicillin] Rash    ROS: Pertinent items in HPI  OBJECTIVE Blood pressure 108/48, pulse 72, temperature 97.9 F (36.6 C), resp. rate 18, height 5' 6.5" (1.689 m), weight 60.328 kg (133 lb), last menstrual period 07/06/2014, unknown if currently breastfeeding. GENERAL: Well-developed, well-nourished female in no acute distress.  HEENT: Normocephalic HEART: normal rate RESP: normal effort ABDOMEN: Soft, non-tender Musculoskeletal: Negative CVA tenderness EXTREMITIES: Nontender, no edema NEURO: Alert and oriented Pelvic exam: Cervix pink, visually closed, without lesion, small amount thick white discharge, vaginal walls and external genitalia with mild erythema Bimanual exam: Cervix 0/long/high, firm, anterior, neg CMT,  uterus nontender, nonenlarged, adnexa without tenderness, enlargement, or mass  LAB RESULTS Results for orders placed or performed during the hospital encounter of 09/08/14 (from the past 24 hour(s))  Urinalysis, Routine w reflex microscopic     Status: Abnormal   Collection Time: 09/08/14 12:30 AM  Result Value Ref Range   Color, Urine RED (A) YELLOW   APPearance CLOUDY (A) CLEAR   Specific Gravity, Urine 1.020 1.005 - 1.030   pH 7.0 5.0 - 8.0   Glucose, UA NEGATIVE NEGATIVE mg/dL   Hgb urine dipstick LARGE (A) NEGATIVE   Bilirubin Urine NEGATIVE NEGATIVE    Ketones, ur NEGATIVE NEGATIVE mg/dL   Protein, ur 161 (A) NEGATIVE mg/dL   Urobilinogen, UA 0.2 0.0 - 1.0 mg/dL   Nitrite POSITIVE (A) NEGATIVE   Leukocytes, UA SMALL (A) NEGATIVE  Urine microscopic-add on     Status: Abnormal   Collection Time: 09/08/14 12:30 AM  Result Value Ref Range   Squamous Epithelial / LPF FEW (A) RARE   WBC, UA 0-2 <3 WBC/hpf   RBC / HPF 21-50 <3 RBC/hpf   Bacteria, UA FEW (A) RARE  Pregnancy, urine POC     Status: None   Collection Time: 09/08/14 12:35 AM  Result Value Ref Range   Preg Test, Ur NEGATIVE NEGATIVE  Wet prep, genital     Status: Abnormal   Collection Time: 09/08/14  1:40 AM  Result Value Ref Range   Yeast Wet Prep HPF POC NONE SEEN NONE SEEN   Trich, Wet Prep NONE SEEN NONE SEEN   Clue Cells Wet Prep HPF POC NONE SEEN NONE SEEN   WBC, Wet Prep HPF POC FEW (A) NONE SEEN    ASSESSMENT 1. UTI (lower urinary tract infection)   2. Vaginal candidiasis     PLAN Discharge home Bactrim DS BID x 7 days, first dose tonight in MAU Pyridium 200 mg TID PRN, first dose in MAU Diflucan 150 mg x 2 doses, 1 to take now and 1 after abx course F/U as scheduled with Wynelle Bourgeois, CNM, in [facility] Return to MAU as needed for emergencies    Medication List    STOP taking these medications        cyclobenzaprine 5 MG tablet  Commonly known as:  FLEXERIL      TAKE these medications        cetirizine 10 MG tablet  Commonly known as:  ZYRTEC  Take 10 mg by mouth daily as needed for allergies.     [facility] PRENATAL GUMMY PO  Take 2 each by mouth daily.     fluconazole 150 MG tablet  Commonly known as:  DIFLUCAN  Take 1 tablet now, and 1 tablet in 1 week after your antibiotic course.     ibuprofen 200 MG tablet  Commonly known as:  ADVIL,MOTRIN  Take 400 mg by mouth every 6 (six) hours as needed.     phenazopyridine 200 MG tablet  Commonly known as:  PYRIDIUM  Take 1 tablet (200 mg total) by mouth 3 (three) times daily as needed for pain.      Phenylephrine-APAP-Guaifenesin 5-325-200 MG Tabs  Take by mouth.     sulfamethoxazole-trimethoprim 800-160 MG per tablet  Commonly known as:  BACTRIM DS,SEPTRA DS  Take 1 tablet by mouth 2 (two) times daily.           Follow-up Information    Follow up with [facility]   Specialty:  Obstetrics and Gynecology   Why:  As scheduled   Contact information:   [address] [place] [id number] [phone]      [facility].   Why:  As needed for emergencies   Contact information:   [address] [id number] mc [place] [id number] [phone]      Sharen CounterLisa Leftwich-Kirby Certified Nurse-Midwife 09/08/2014  2:26 AM

## 2014-09-08 NOTE — Discharge Instructions (Signed)
Monilial Vaginitis Vaginitis in a soreness, swelling and redness (inflammation) of the vagina and vulva. Monilial vaginitis is not a sexually transmitted infection. CAUSES  Yeast vaginitis is caused by yeast (candida) that is normally found in your vagina. With a yeast infection, the candida has overgrown in number to a point that upsets the chemical balance. SYMPTOMS   White, thick vaginal discharge.  Swelling, itching, redness and irritation of the vagina and possibly the lips of the vagina (vulva).  Burning or painful urination.  Painful intercourse. DIAGNOSIS  Things that may contribute to monilial vaginitis are:  Postmenopausal and virginal states.  Pregnancy.  Infections.  Being tired, sick or stressed, especially if you had monilial vaginitis in the past.  Diabetes. Good control will help lower the chance.  Birth control pills.  Tight fitting garments.  Using bubble bath, feminine sprays, douches or deodorant tampons.  Taking certain medications that kill germs (antibiotics).  Sporadic recurrence can occur if you become ill. TREATMENT  Your caregiver will give you medication.  There are several kinds of anti monilial vaginal creams and suppositories specific for monilial vaginitis. For recurrent yeast infections, use a suppository or cream in the vagina 2 times a week, or as directed.  Anti-monilial or steroid cream for the itching or irritation of the vulva may also be used. Get your caregiver's permission.  Painting the vagina with methylene blue solution may help if the monilial cream does not work.  Eating yogurt may help prevent monilial vaginitis. HOME CARE INSTRUCTIONS   Finish all medication as prescribed.  Do not have sex until treatment is completed or after your caregiver tells you it is okay.  Take warm sitz baths.  Do not douche.  Do not use tampons, especially scented ones.  Wear cotton underwear.  Avoid tight pants and panty  hose.  Tell your sexual partner that you have a yeast infection. They should go to their caregiver if they have symptoms such as mild rash or itching.  Your sexual partner should be treated as well if your infection is difficult to eliminate.  Practice safer sex. Use condoms.  Some vaginal medications cause latex condoms to fail. Vaginal medications that harm condoms are:  Cleocin cream.  Butoconazole (Femstat).  Terconazole (Terazol) vaginal suppository.  Miconazole (Monistat) (may be purchased over the counter). SEEK MEDICAL CARE IF:   You have a temperature by mouth above 102 F (38.9 C).  The infection is getting worse after 2 days of treatment.  The infection is not getting better after 3 days of treatment.  You develop blisters in or around your vagina.  You develop vaginal bleeding, and it is not your menstrual period.  You have pain when you urinate.  You develop intestinal problems.  You have pain with sexual intercourse. Document Released: 02/25/2005 Document Revised: 08/10/2011 Document Reviewed: 11/09/2008 ExitCare Patient Information 2015 [employer]. This information is not intended to replace advice given to you by your health care provider. Make sure you discuss any questions you have with your health care provider. Urinary Tract Infection Urinary tract infections (UTIs) can develop anywhere along your urinary tract. Your urinary tract is your body's drainage system for removing wastes and extra water. Your urinary tract includes two kidneys, two ureters, a bladder, and a urethra. Your kidneys are a pair of bean-shaped organs. Each kidney is about the size of your fist. They are located below your ribs, one on each side of your spine. CAUSES Infections are caused by microbes, which are   microscopic organisms, including fungi, viruses, and bacteria. These organisms are so small that they can only be seen through a microscope. Bacteria are the microbes that  most commonly cause UTIs. SYMPTOMS  Symptoms of UTIs may vary by age and gender of the patient and by the location of the infection. Symptoms in young women typically include a frequent and intense urge to urinate and a painful, burning feeling in the bladder or urethra during urination. Older women and men are more likely to be tired, shaky, and weak and have muscle aches and abdominal pain. A fever may mean the infection is in your kidneys. Other symptoms of a kidney infection include pain in your back or sides below the ribs, nausea, and vomiting. DIAGNOSIS To diagnose a UTI, your caregiver will ask you about your symptoms. Your caregiver also will ask to provide a urine sample. The urine sample will be tested for bacteria and white blood cells. White blood cells are made by your body to help fight infection. TREATMENT  Typically, UTIs can be treated with medication. Because most UTIs are caused by a bacterial infection, they usually can be treated with the use of antibiotics. The choice of antibiotic and length of treatment depend on your symptoms and the type of bacteria causing your infection. HOME CARE INSTRUCTIONS  If you were prescribed antibiotics, take them exactly as your caregiver instructs you. Finish the medication even if you feel better after you have only taken some of the medication.  Drink enough water and fluids to keep your urine clear or pale yellow.  Avoid caffeine, tea, and carbonated beverages. They tend to irritate your bladder.  Empty your bladder often. Avoid holding urine for long periods of time.  Empty your bladder before and after sexual intercourse.  After a bowel movement, women should cleanse from front to back. Use each tissue only once. SEEK MEDICAL CARE IF:   You have back pain.  You develop a fever.  Your symptoms do not begin to resolve within 3 days. SEEK IMMEDIATE MEDICAL CARE IF:   You have severe back pain or lower abdominal pain.  You  develop chills.  You have nausea or vomiting.  You have continued burning or discomfort with urination. MAKE SURE YOU:   Understand these instructions.  Will watch your condition.  Will get help right away if you are not doing well or get worse. Document Released: 02/25/2005 Document Revised: 11/17/2011 Document Reviewed: 06/26/2011 ExitCare Patient Information 2015 [employer]. This information is not intended to replace advice given to you by your health care provider. Make sure you discuss any questions you have with your health care provider.  

## 2014-09-13 ENCOUNTER — Encounter: Payer: Self-pay | Admitting: Advanced Practice Midwife

## 2014-09-13 ENCOUNTER — Ambulatory Visit (INDEPENDENT_AMBULATORY_CARE_PROVIDER_SITE_OTHER): Payer: Self-pay | Admitting: Advanced Practice Midwife

## 2014-09-13 VITALS — BP 98/46 | HR 63 | Temp 98.0°F | Ht 64.0 in | Wt 130.9 lb

## 2014-09-13 DIAGNOSIS — O021 Missed abortion: Secondary | ICD-10-CM

## 2014-09-13 MED ORDER — NORGESTIMATE-ETH ESTRADIOL 0.25-35 MG-MCG PO TABS: 1.0000 | ORAL_TABLET | Freq: Every day | ORAL | Status: DC

## 2014-09-13 NOTE — Patient Instructions (Signed)
Oral Contraception Use Oral contraceptive pills (OCPs) are medicines taken to prevent pregnancy. OCPs work by preventing the ovaries from releasing eggs. The hormones in OCPs also cause the cervical mucus to thicken, preventing the sperm from entering the uterus. The hormones also cause the uterine lining to become thin, not allowing a fertilized egg to attach to the inside of the uterus. OCPs are highly effective when taken exactly as prescribed. However, OCPs do not prevent sexually transmitted diseases (STDs). Safe sex practices, such as using condoms along with an OCP, can help prevent STDs. Before taking OCPs, you may have a physical exam and Pap test. Your health care provider may also order blood tests if necessary. Your health care provider will make sure you are a good candidate for oral contraception. Discuss with your health care provider the possible side effects of the OCP you may be prescribed. When starting an OCP, it can take 2 to 3 months for the body to adjust to the changes in hormone levels in your body.  HOW TO TAKE ORAL CONTRACEPTIVE PILLS Your health care provider may advise you on how to start taking the first cycle of OCPs. Otherwise, you can:   Start on day 1 of your menstrual period. You will not need any backup contraceptive protection with this start time.   Start on the first Sunday after your menstrual period or the day you get your prescription. In these cases, you will need to use backup contraceptive protection for the first week.   Start the pill at any time of your cycle. If you take the pill within 5 days of the start of your period, you are protected against pregnancy right away. In this case, you will not need a backup form of birth control. If you start at any other time of your menstrual cycle, you will need to use another form of birth control for 7 days. If your OCP is the type called a minipill, it will protect you from pregnancy after taking it for 2 days (48  hours). After you have started taking OCPs:   If you forget to take 1 pill, take it as soon as you remember. Take the next pill at the regular time.   If you miss 2 or more pills, call your health care provider because different pills have different instructions for missed doses. Use backup birth control until your next menstrual period starts.   If you use a 28-day pack that contains inactive pills and you miss 1 of the last 7 pills (pills with no hormones), it will not matter. Throw away the rest of the non-hormone pills and start a new pill pack.  No matter which day you start the OCP, you will always start a new pack on that same day of the week. Have an extra pack of OCPs and a backup contraceptive method available in case you miss some pills or lose your OCP pack.  HOME CARE INSTRUCTIONS   Do not smoke.   Always use a condom to protect against STDs. OCPs do not protect against STDs.   Use a calendar to mark your menstrual period days.   Read the information and directions that came with your OCP. Talk to your health care provider if you have questions.  SEEK MEDICAL CARE IF:   You develop nausea and vomiting.   You have abnormal vaginal discharge or bleeding.   You develop a rash.   You miss your menstrual period.   You are losing   your hair.   You need treatment for mood swings or depression.   You get dizzy when taking the OCP.   You develop acne from taking the OCP.   You become pregnant.  SEEK IMMEDIATE MEDICAL CARE IF:   You develop chest pain.   You develop shortness of breath.   You have an uncontrolled or severe headache.   You develop numbness or slurred speech.   You develop visual problems.   You develop pain, redness, and swelling in the legs.  Document Released: 05/07/2011 Document Revised: 10/02/2013 Document Reviewed: 11/06/2012 ExitCare Patient Information 2015 [employer]. This information is not intended to replace  advice given to you by your health care provider. Make sure you discuss any questions you have with your health care provider.  

## 2014-09-13 NOTE — Progress Notes (Signed)
  Subjective:     [name] is a 39 y.o. female who presents to the clinic 4 weeks status post Spontaneous abortion, with no procedure done for SAB. Eating a regular diet without difficulty. Bowel movements are normal. The patient is not having any pain.  Long discussion of recent UTI (treatd 09/08/14) and different methods for contraception.  The following portions of the patient's history were reviewed and updated as appropriate: allergies, current medications, past family history, past medical history, past social history, past surgical history and problem list.  Review of Systems Pertinent items are noted in HPI.    Objective:    BP 98/46 mmHg  Pulse 63  Temp(Src) 98 F (36.7 C) (Oral)  Ht 5\' 4"  (1.626 m)  Wt 130 lb 14.4 oz (59.376 kg)  BMI 22.46 kg/m2  LMP 07/06/2014 General:  alert, cooperative and no distress  Abdomen: soft, bowel sounds active, non-tender  Incision:   No incision, no procedure done     Assessment:    Doing well post SAB     Plan:    1. Discussed issues surrounding SAB and future plans 2. .Desires contraception, Likes patch but cannot afford now. Wants to try OCPs. Rx for Sprintec sent to pharmacy 3. Activity restrictions: none 4. Anticipated return to work: now. 5. Follow up: 1 Month for BP check.

## 2014-09-25 IMAGING — CR DG NASAL BONES 3+V
3 series · 3 of 3 positions shown · non-contrast
Comparison: None.

CLINICAL DATA: 37-year-old female with nose injury and pain.

EXAM:
NASAL BONES - 3+ VIEW

[view not recorded (1 of 3)]
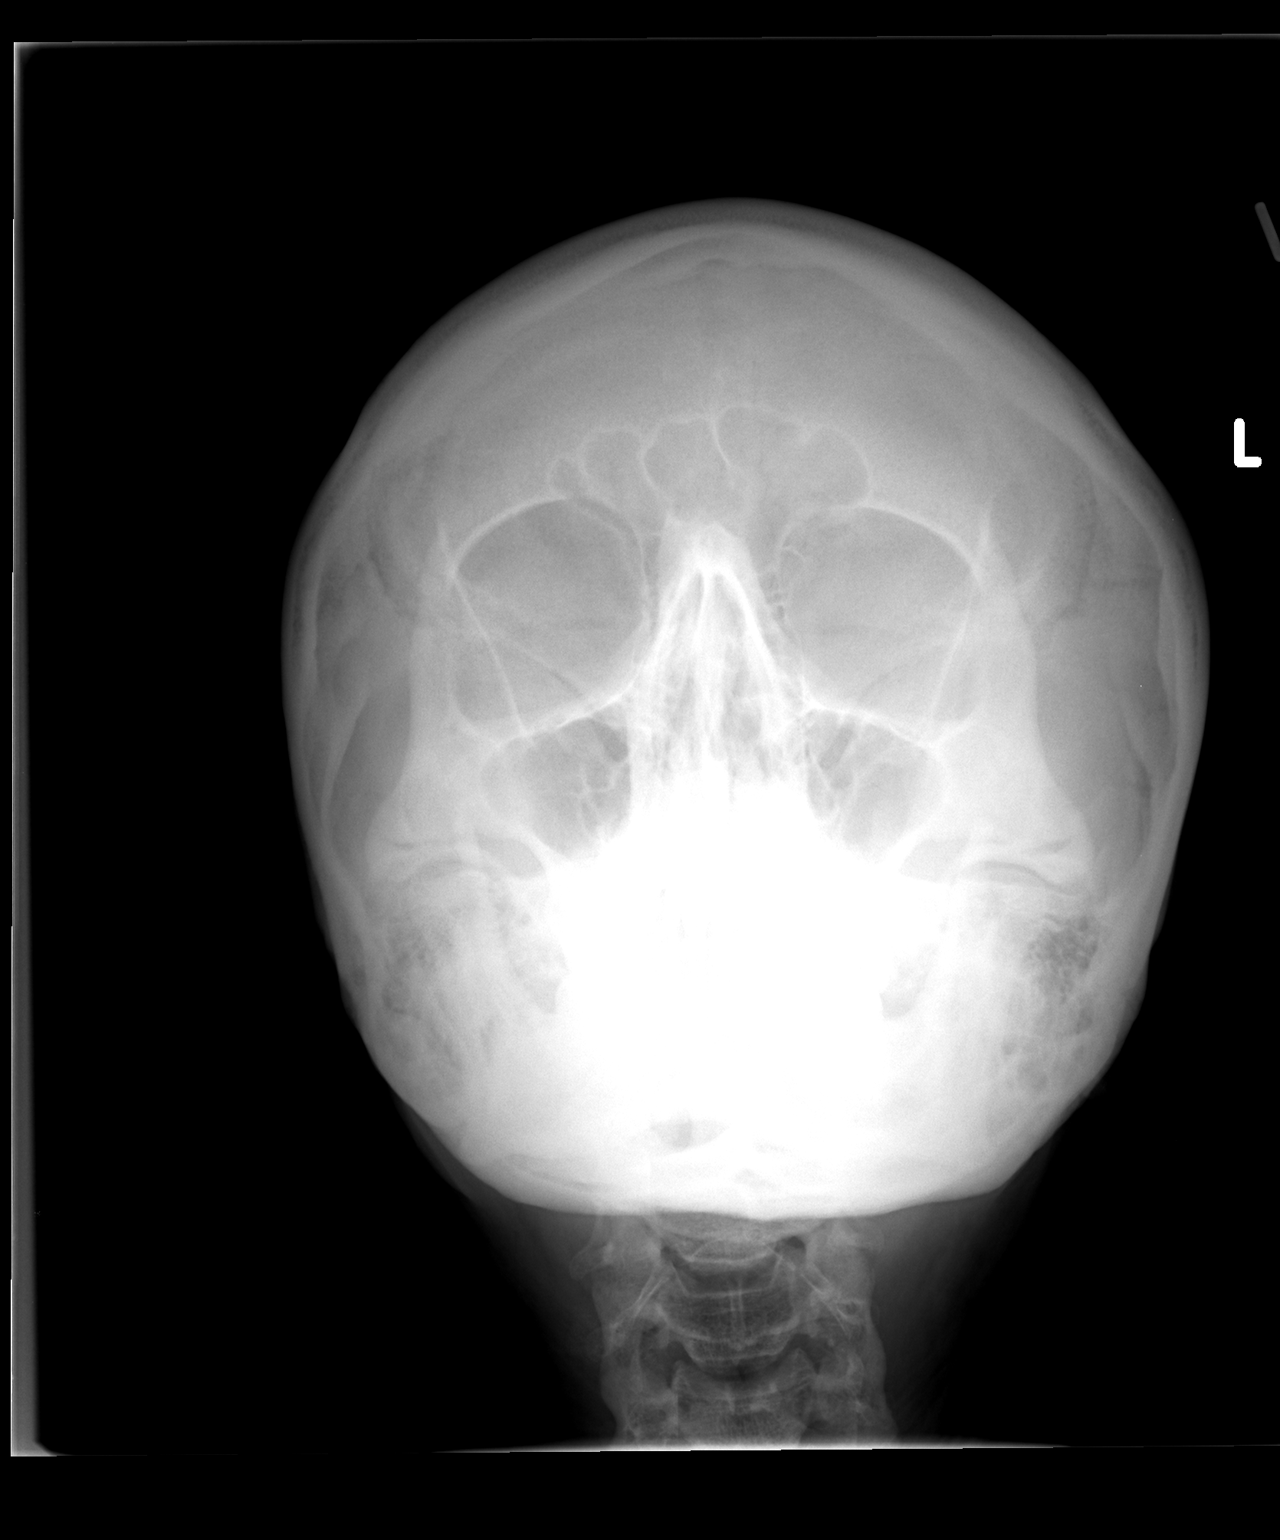

[view not recorded (2 of 3)]
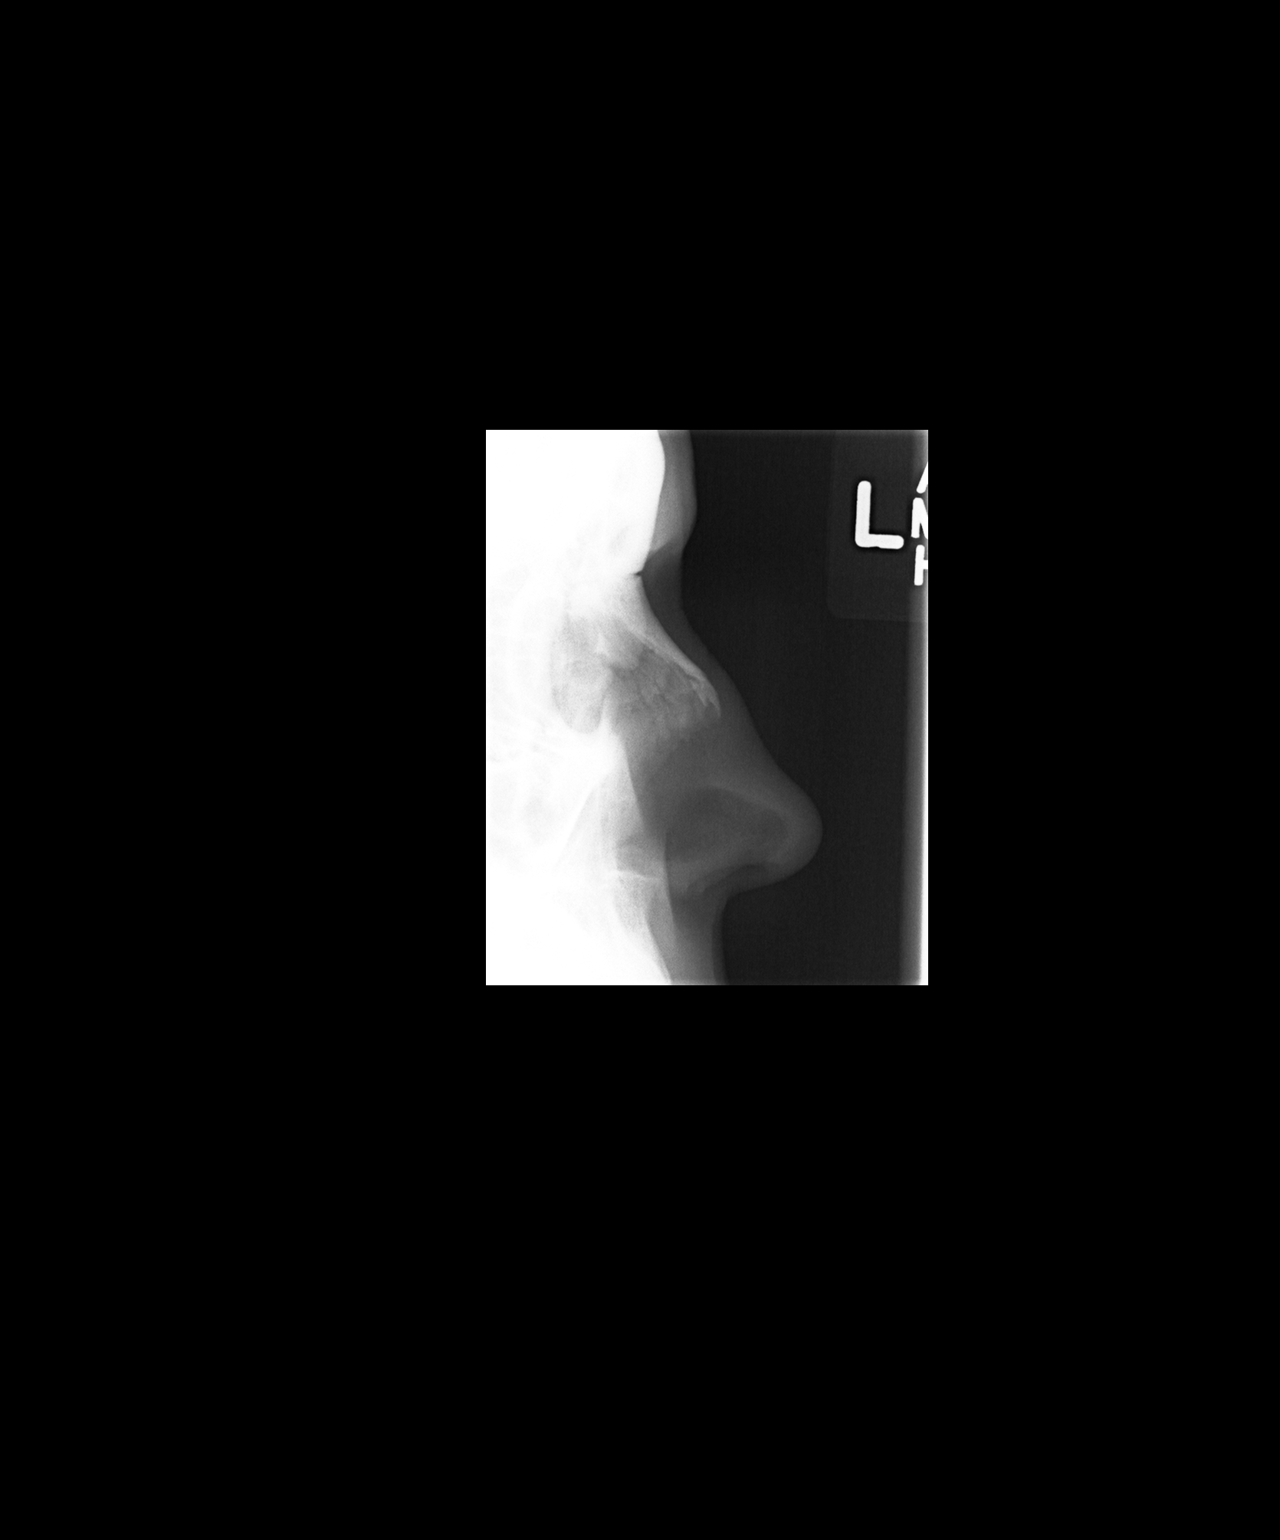

[view not recorded (3 of 3)]
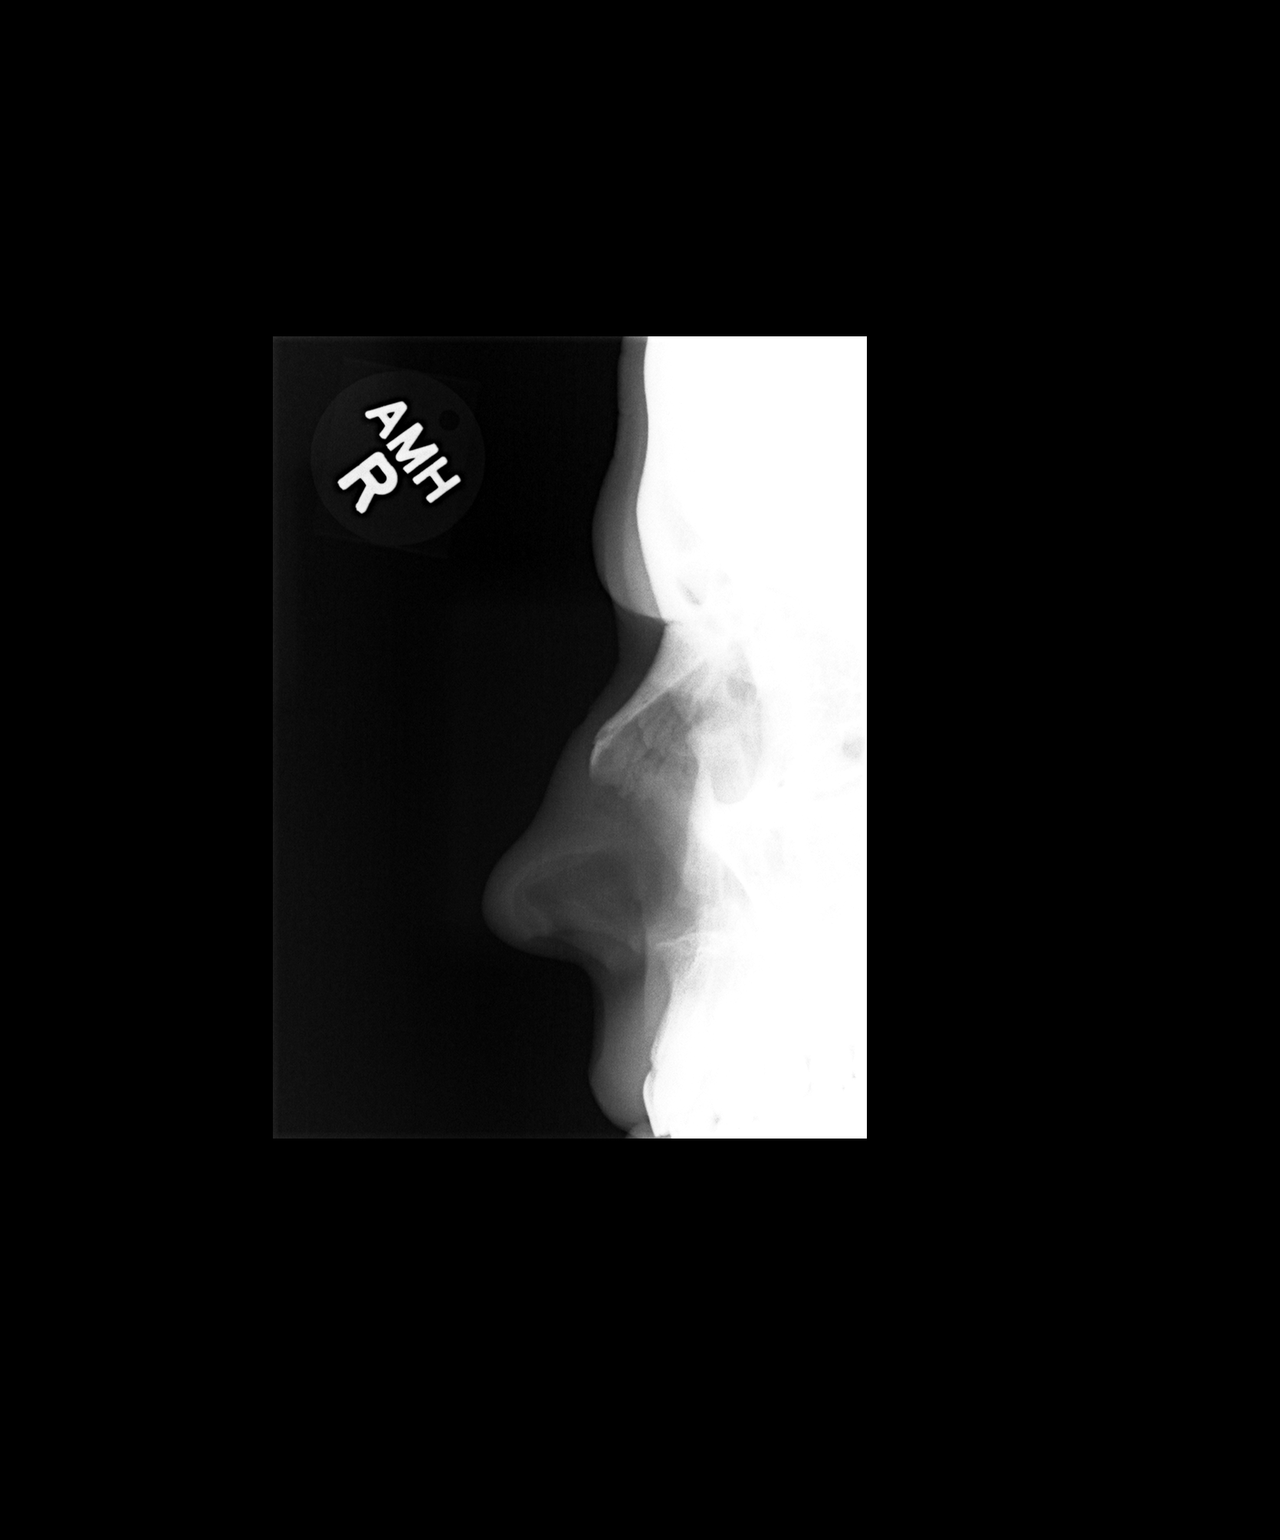

[3 of 3 positions shown; findings below may reference images not displayed]

FINDINGS: There is no evidence of acute fracture or other bone abnormality.
IMPRESSION: Negative.

## 2014-10-25 ENCOUNTER — Ambulatory Visit: Payer: Self-pay | Admitting: Medical

## 2014-10-25 ENCOUNTER — Telehealth: Payer: Self-pay | Admitting: General Practice

## 2014-10-25 NOTE — Telephone Encounter (Signed)
Patient missed appt in office today to have BP checked. Called patient, no answer- left message stating we are trying to reach you in regards to the appt you missed in our office today, please call the front office so we can reschedule your appt to get your BP checked.

## 2015-03-12 ENCOUNTER — Telehealth: Payer: Self-pay | Admitting: General Practice

## 2015-03-12 NOTE — Telephone Encounter (Signed)
Patient called and left message stating she was seen here back in April and saw Wynelle Bourgeois for follow up after miscarriage. Patient states she was supposed to send in a prescription for OCPs but none ever went to her pharmacy. Patient states she would like that prescription now and also Wynelle Bourgeois had said something about a blood pressure check and she wants to know what that is about. Per chart review, patient does not have hx of high blood pressure. Looks like recommendation was for screening purposes with OCPs. Called [facility] who states they did not receive prescription but would fill it now. Called patient- no answer. Left message stating we are trying to reach you to return your phone call. We wanted to let you know we received your message and your prescription has been sent to your pharmacy. If you have other questions or concerns, you may call us back

## 2015-03-13 ENCOUNTER — Telehealth: Payer: Self-pay | Admitting: *Deleted

## 2015-03-13 NOTE — Telephone Encounter (Signed)
Pt called clinic stating prescription was not called into the pharmacy, request call back.  Confirmed medication is filled at pharmacy. Sprintec.  Attempted to contact patient, no answer, left message that medication is ready for pick up.  If you have any further questions, please call the clinic.

## 2015-10-16 IMAGING — US US OB COMP LESS 14 WK
1 series · 14 of 28 positions shown · non-contrast
Comparison: Pelvic ultrasound 10/14/2009

CLINICAL DATA: Pregnant patient with vaginal bleeding.

EXAM:
OBSTETRIC <14 WK US AND TRANSVAGINAL OB US
TECHNIQUE: Both transabdominal and transvaginal ultrasound examinations were
performed for complete evaluation of the gestation as well as the
maternal uterus, adnexal regions, and pelvic cul-de-sac.
Transvaginal technique was performed to assess early pregnancy.

[Series 1: us ob comp less 14 wk · 0.20mm/px · 14 of 48 slices shown]
[im 2/48]
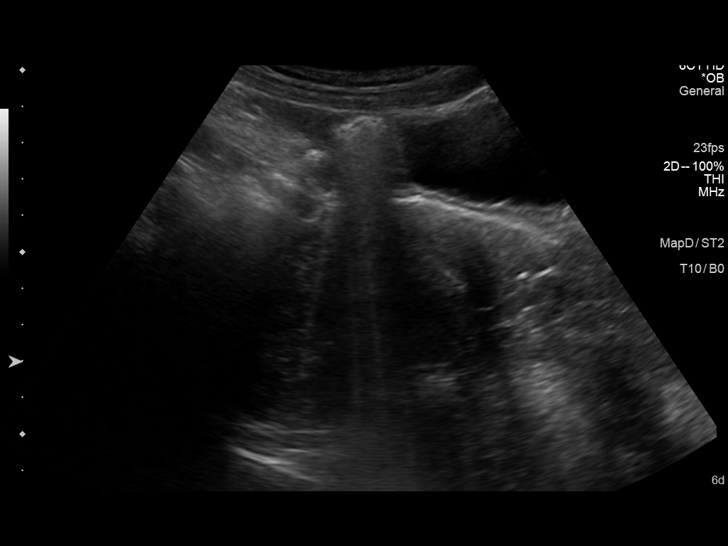
[im 6/48]
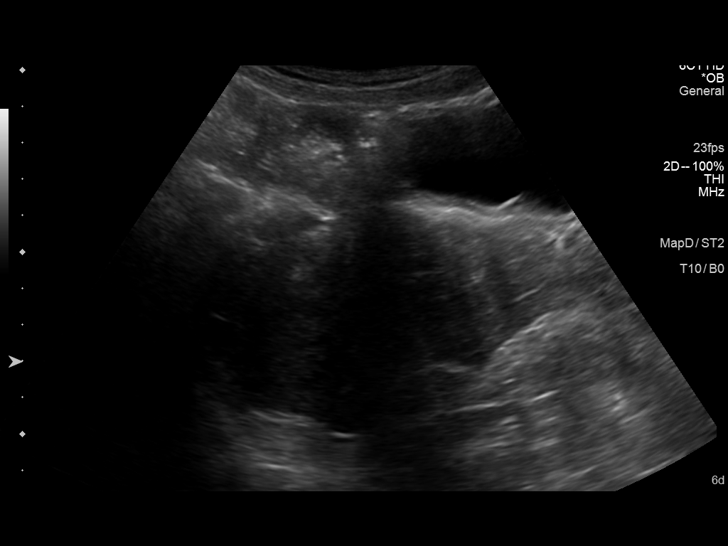
[im 9/48]
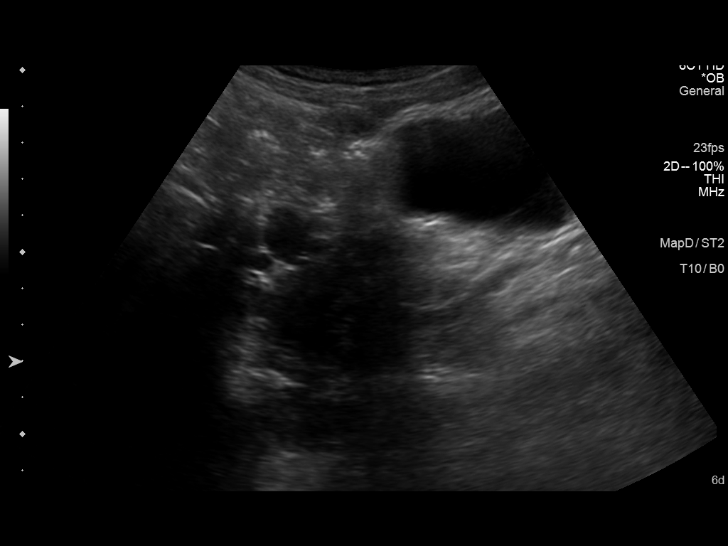
[im 13/48]
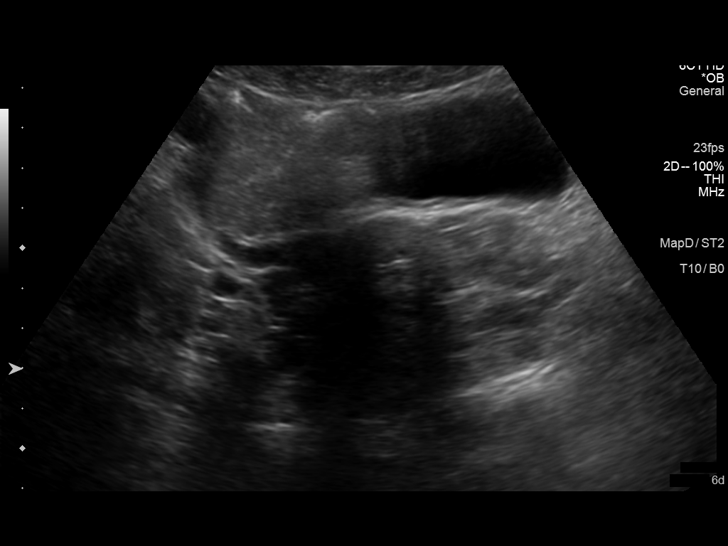
[im 16/48]
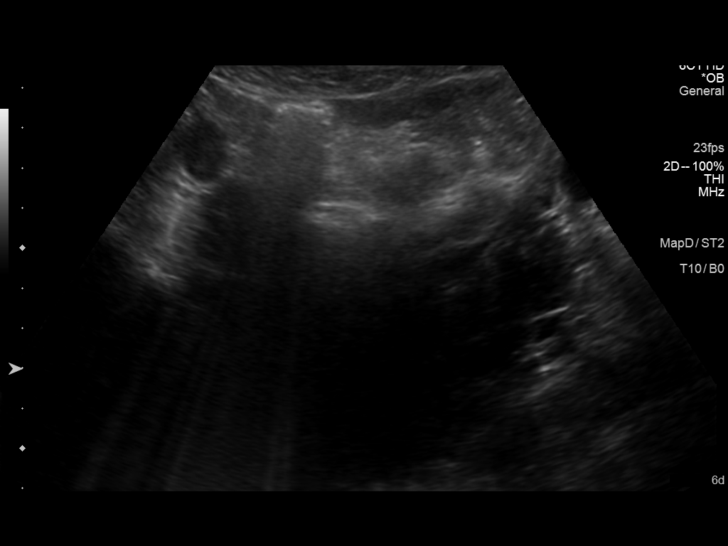
[im 20/48]
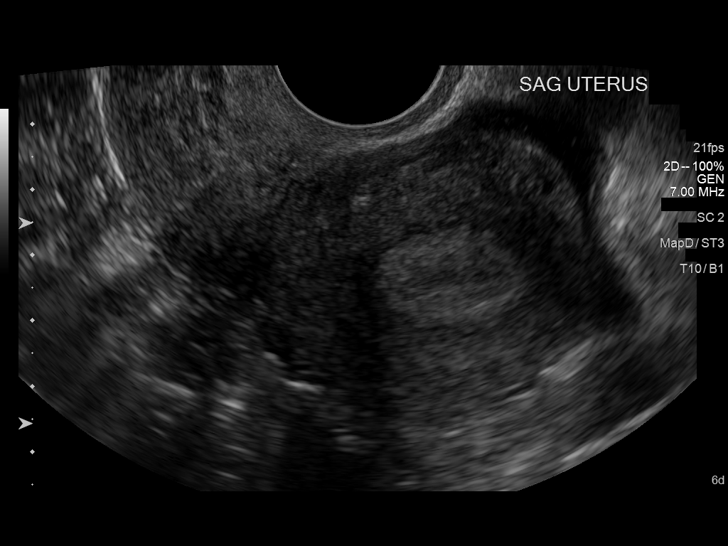
[im 23/48]
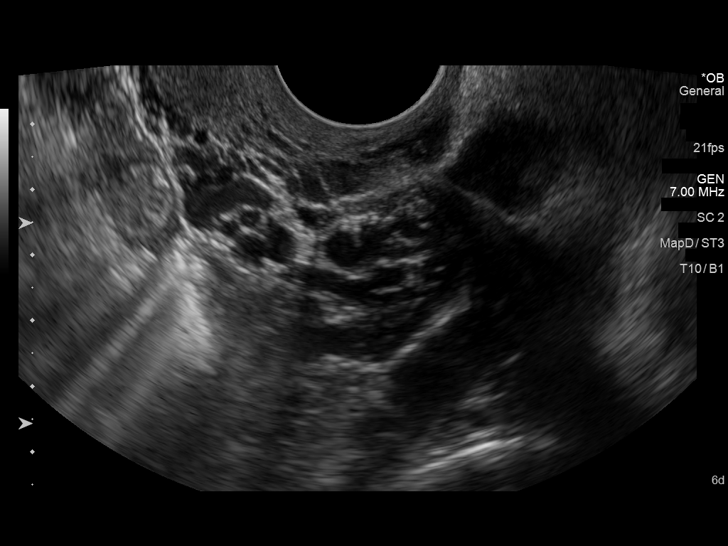
[im 27/48]
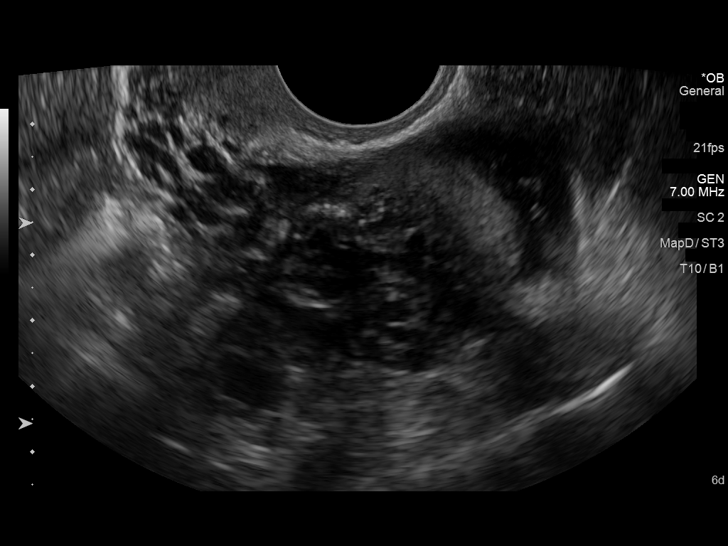
[im 30/48]
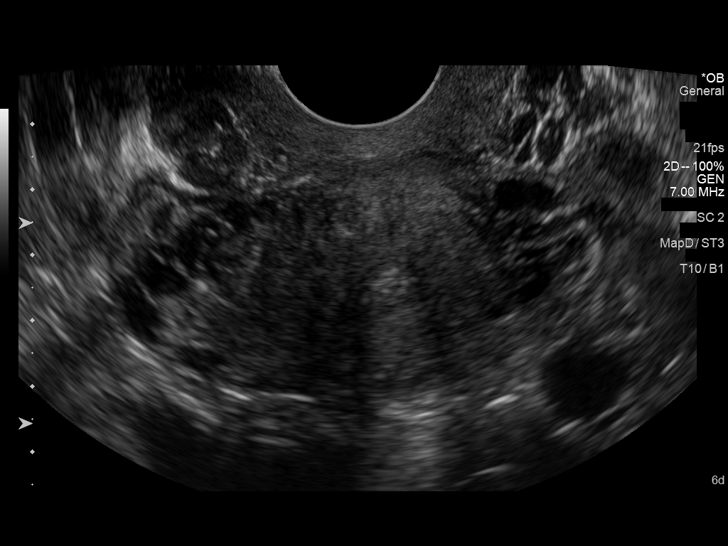
[im 34/48]
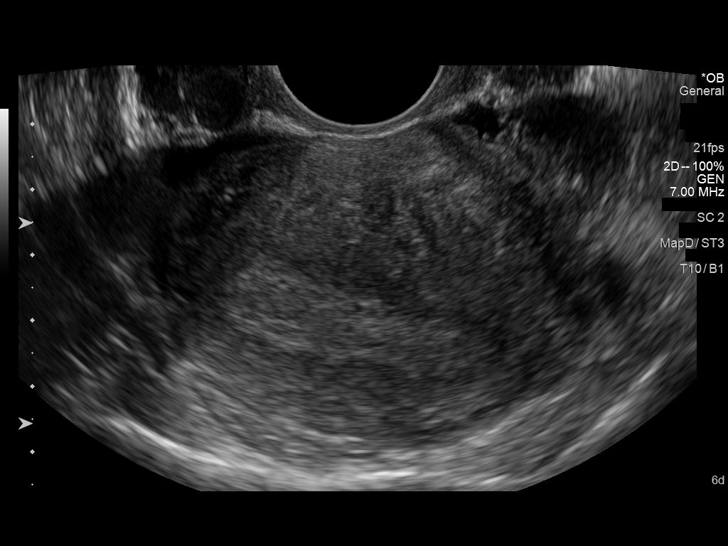
[im 37/48]
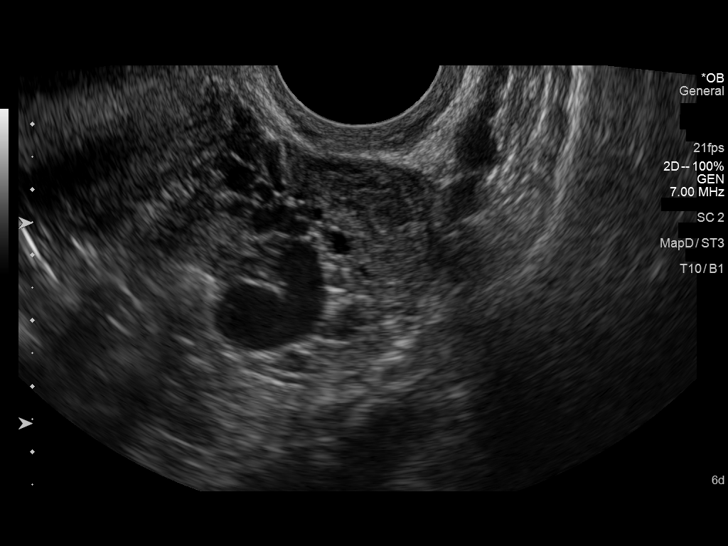
[im 41/48]
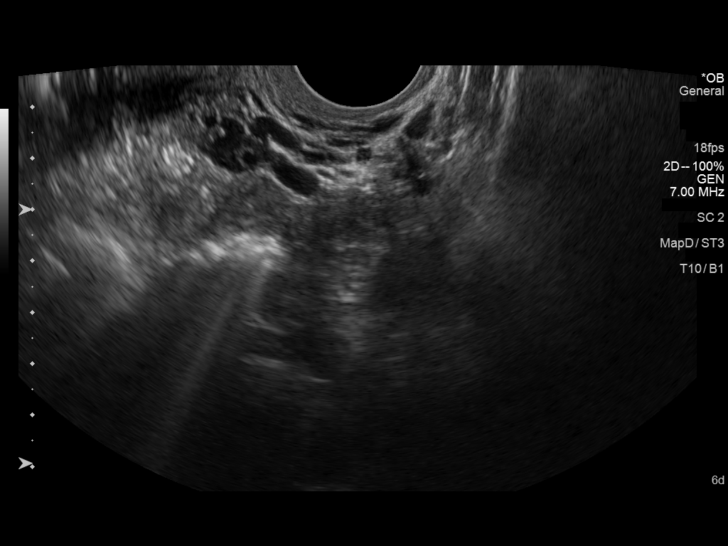
[im 44/48]
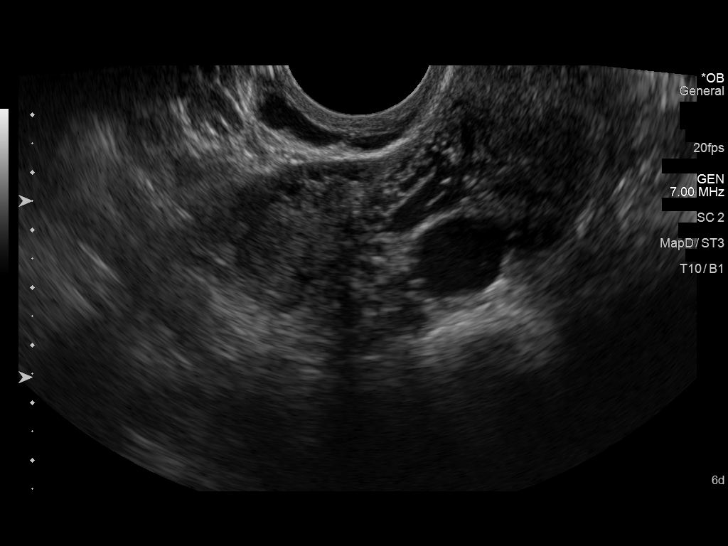
[im 48/48]
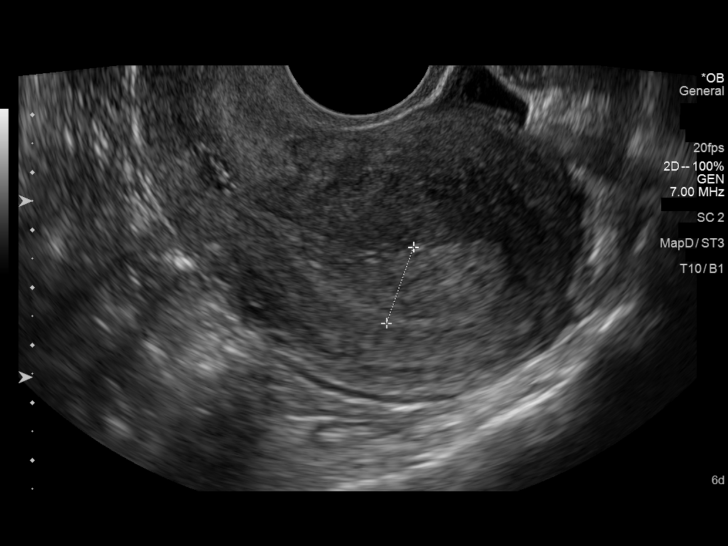

[14 of 28 positions shown; findings below may reference images not displayed]

FINDINGS: Intrauterine gestational sac: Not present

Yolk sac:  Not present

Embryo:  Not present

Cardiac Activity: Not present

Maternal uterus/adnexae: Normal right and left ovaries. No
subchorionic hemorrhage. Small amount of free fluid in the pelvis.
The uterus is retroverted.
IMPRESSION: No intrauterine pregnancy is identified. In the setting of positive
pregnancy test and no definite intrauterine pregnancy, this reflects
a pregnancy of unknown location. Differential considerations include
early normal IUP, abnormal IUP, or nonvisualized ectopic pregnancy.
Differentiation is achieved with serial beta HCG supplemented by
repeat sonography as clinically warranted.

## 2015-11-19 ENCOUNTER — Ambulatory Visit (HOSPITAL_COMMUNITY)
Admission: EM | Admit: 2015-11-19 | Discharge: 2015-11-19 | Disposition: A | Discharge status: Home / Self Care | Payer: Self-pay | Attending: Family Medicine | Admitting: Family Medicine

## 2015-11-19 ENCOUNTER — Encounter (HOSPITAL_COMMUNITY): Payer: Self-pay | Admitting: Emergency Medicine

## 2015-11-19 DIAGNOSIS — S0083XA Contusion of other part of head, initial encounter: Secondary | ICD-10-CM

## 2015-11-19 NOTE — Discharge Instructions (Signed)
Ice today then heat starting on wed, advil as needed, ok to work as discussed, return if any problems.

## 2015-11-19 NOTE — ED Notes (Signed)
Patient reports walking dogs yesterday, when going into the house, tripped over the dogs, striking right side of face on the corner of a counter.  No loc.  Patient has swelling and soreness to right cheek bone.  Patient reports history of a concussion, but this  does not feel like a concussion.  No fogginess, no nausea.  Patient did not go to work.  Work place requires a doctor night so patient can use vacation time at her work place.

## 2015-11-19 NOTE — ED Provider Notes (Signed)
CSN: [id number]     Arrival date & time 11/19/15  1542 History   First MD Initiated Contact with Patient 11/19/15 1550     Chief Complaint  Patient presents with  . Head Injury   (Consider location/radiation/quality/duration/timing/severity/associated sxs/prior Treatment) Patient is a 40 y.o. female presenting with head injury. The history is provided by the patient.  Head Injury Location:  R temporal Mechanism of injury: fall   Mechanism of injury comment:  Hit face on counter when tripped over her dogs. no loc, does not feel like her prior concussion sx, still sore, no loc.,here for work note.   Past Medical History  Diagnosis Date  . SVD (spontaneous vaginal delivery)     x 3  . Reflux   . PONV (postoperative nausea and vomiting)   . Missed abortion 02/19/2012  . History of induced abortion 02/19/2012    EAB x3 w/ G1 ('00), G3 ('05), G4 ('06)  . 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 Asthma 02/19/2012  . History of abnormal Pap smear 02/19/2012    2002; had colpo's and questionable LEEP   Past Surgical History  Procedure Laterality Date  . Wisdom tooth extraction    . Dilation and evacuation  02/19/2012    Procedure: DILATATION AND EVACUATION;  Surgeon: Michael Litter, MD;  Location: [facility];  Service: Gynecology;  Laterality: N/A;   Family History  Problem Relation Age of Onset  . Cancer Maternal Grandmother   . Diabetes Maternal Grandfather    Social History  Substance Use Topics  . Smoking status: Never Smoker   . Smokeless tobacco: Never Used  . Alcohol Use: No   OB History    Gravida Para Term Preterm AB TAB SAB Ectopic Multiple Living   Review of Systems  Constitutional: Negative.   HENT: Positive for facial swelling.   Musculoskeletal: Negative.   Skin: Positive for wound.  Neurological: Negative for dizziness and light-headedness.    Allergies  Penicillins and Amoxil  Home Medications   Prior to Admission medications   Medication  Sig Start Date End Date Taking? Authorizing Provider  cetirizine (ZYRTEC) 10 MG tablet Take 10 mg by mouth daily as needed for allergies.    Historical Provider, MD  fluconazole (DIFLUCAN) 150 MG tablet Take 1 tablet now, and 1 tablet in 1 week after your antibiotic course. Patient not taking: Reported on 09/13/2014 09/08/14   Misty Stanley A Leftwich-Kirby, CNM  ibuprofen (ADVIL,MOTRIN) 200 MG tablet Take 400 mg by mouth every 6 (six) hours as needed.    Historical Provider, MD  norgestimate-ethinyl estradiol (ORTHO-CYCLEN,SPRINTEC,PREVIFEM) 0.25-35 MG-MCG tablet Take 1 tablet by mouth daily. 09/13/14   Aviva Signs, CNM  phenazopyridine (PYRIDIUM) 200 MG tablet Take 1 tablet (200 mg total) by mouth 3 (three) times daily as needed for pain. Patient not taking: Reported on 09/13/2014 09/08/14   Wilmer Floor Leftwich-Kirby, CNM  Phenylephrine-APAP-Guaifenesin 5-325-200 MG TABS Take by mouth.    Historical Provider, MD  Prenatal Vit-Min-FA-Fish Oil ([facility] PRENATAL GUMMY PO) Take 2 each by mouth daily.    Historical Provider, MD  sulfamethoxazole-trimethoprim (BACTRIM DS,SEPTRA DS) 800-160 MG per tablet Take 1 tablet by mouth 2 (two) times daily. 09/08/14   Wilmer Floor Leftwich-Kirby, CNM   Meds Ordered and Administered this Visit  Medications - No data to display  BP 107/52 mmHg  Pulse 73  Temp(Src) 98.4 F (36.9 C) (Oral)  Resp 16  SpO2 97%  LMP 11/11/2015 No data found.   Physical Exam  Constitutional: She is oriented to person, place, and time. She appears well-developed and well-nourished. No distress.  HENT:  Nose: Nose normal.  Eyes: Conjunctivae are normal. Pupils are equal, round, and reactive to light.  Neck: Normal range of motion. Neck supple.  Neurological: She is alert and oriented to person, place, and time.  Skin: Skin is warm and dry.  Tender mild hematoma right zygoma and supraauricular face.scalp. No lac, tm clear, nose and teeth intact.  Nursing note and vitals reviewed.   ED Course   Procedures (including critical care time)  Labs Review Labs Reviewed - No data to display  Imaging Review No results found.   Visual Acuity Review  Right Eye Distance:   Left Eye Distance:   Bilateral Distance:    Right Eye Near:   Left Eye Near:    Bilateral Near:         MDM   1. Facial contusion, initial encounter        Linna HoffJames D Kindl, MD 11/19/15 [phone]

## 2015-11-24 ENCOUNTER — Emergency Department
Admission: EM | Admit: 2015-11-24 | Discharge: 2015-11-24 | Disposition: A | Discharge status: Home / Self Care | Payer: Self-pay | Source: Home / Self Care | Attending: Family Medicine | Admitting: Family Medicine

## 2015-11-24 DIAGNOSIS — S0990XD Unspecified injury of head, subsequent encounter: Secondary | ICD-10-CM

## 2015-11-24 NOTE — ED Notes (Signed)
Pt fell last Monday and hit her face on her countertop.  She went to urgent care, and was seen.  She is still not feeling any better.  She has tingling on the side of her face, a bump above her right ear.  She said she is just not feeling well, and feeling like herself.  Her face aches.

## 2015-11-24 NOTE — ED Provider Notes (Signed)
CSN: [id number]     Arrival date & time 11/24/15  1419 History   First MD Initiated Contact with Patient 11/24/15 1537     Chief Complaint  Patient presents with  . Fall      HPI Comments: Patient fell in her kitchen five days ago, striking her right head/face on countertop.  She was evaluated in urgent care at that time. She complains of persistent right facial pain/swelling with persistent mild headache.  She feels fatigued, with occasional nausea (without vomiting), and difficulty concentrating.  She has mild pain when chewing and opening her jaw, but no trismus.  No fevers, chills, and sweats.  Patient is a 40 y.o. female presenting with head injury. The history is provided by the patient.  Head Injury Location:  R temporal Time since incident:  5 days Mechanism of injury: fall   Pain details:    Quality:  Aching   Severity:  Moderate   Duration:  5 days   Timing:  Constant   Progression:  Unchanged Chronicity:  New Relieved by:  Nothing Worsened by:  Pressure and movement Ineffective treatments:  NSAIDs Associated symptoms: headache and nausea   Associated symptoms: no blurred vision, no disorientation, no double vision, no focal weakness, no hearing loss, no loss of consciousness, no memory loss, no neck pain, no numbness, no seizures, no tinnitus and no vomiting     Past Medical History  Diagnosis Date  . SVD (spontaneous vaginal delivery)     x 3  . Reflux   . PONV (postoperative nausea and vomiting)   . Missed abortion 02/19/2012  . History of induced abortion 02/19/2012    EAB x3 w/ G1 ('00), G3 ('05), G4 ('06)  . 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 Asthma 02/19/2012  . History of abnormal Pap smear 02/19/2012    2002; had colpo's and questionable LEEP   Past Surgical History  Procedure Laterality Date  . Wisdom tooth extraction    . Dilation and evacuation  02/19/2012    Procedure: DILATATION AND EVACUATION;  Surgeon: Michael LitterNaima A Dillard, MD;  Location: [facility];   Service: Gynecology;  Laterality: N/A;   Family History  Problem Relation Age of Onset  . Cancer Maternal Grandmother   . Diabetes Maternal Grandfather    Social History  Substance Use Topics  . Smoking status: Never Smoker   . Smokeless tobacco: Never Used  . Alcohol Use: No   OB History    Gravida Para Term Preterm AB TAB SAB Ectopic Multiple Living   9 3 3  4 3 1   3      Review of Systems  HENT: Negative for hearing loss and tinnitus.   Eyes: Negative for blurred vision and double vision.  Gastrointestinal: Positive for nausea. Negative for vomiting.  Musculoskeletal: Negative for neck pain.  Neurological: Positive for headaches. Negative for focal weakness, seizures, loss of consciousness and numbness.  Psychiatric/Behavioral: Negative for memory loss.  All other systems reviewed and are negative.   Allergies  Penicillins and Amoxil  Home Medications   Prior to Admission medications   Medication Sig Start Date End Date Taking? Authorizing Provider  cetirizine (ZYRTEC) 10 MG tablet Take 10 mg by mouth daily as needed for allergies.    Historical Provider, MD  fluconazole (DIFLUCAN) 150 MG tablet Take 1 tablet now, and 1 tablet in 1 week after your antibiotic course. Patient not taking: Reported on 09/13/2014 09/08/14   Hurshel PartyLisa A Leftwich-Kirby, CNM  ibuprofen (ADVIL,MOTRIN) 200 MG tablet Take 400 mg by mouth every 6 (six) hours as needed.    Historical Provider, MD  norgestimate-ethinyl estradiol (ORTHO-CYCLEN,SPRINTEC,PREVIFEM) 0.25-35 MG-MCG tablet Take 1 tablet by mouth daily. 09/13/14   Aviva SignsMarie L Williams, CNM  phenazopyridine (PYRIDIUM) 200 MG tablet Take 1 tablet (200 mg total) by mouth 3 (three) times daily as needed for pain. Patient not taking: Reported on 09/13/2014 09/08/14   Wilmer FloorLisa A Leftwich-Kirby, CNM  Phenylephrine-APAP-Guaifenesin 5-325-200 MG TABS Take by mouth.    Historical Provider, MD  Prenatal Vit-Min-FA-Fish Oil ([employer] PRENATAL GUMMY PO) Take 2 each by mouth  daily.    Historical Provider, MD  sulfamethoxazole-trimethoprim (BACTRIM DS,SEPTRA DS) 800-160 MG per tablet Take 1 tablet by mouth 2 (two) times daily. 09/08/14   Wilmer FloorLisa A Leftwich-Kirby, CNM   Meds Ordered and Administered this Visit  Medications - No data to display  BP 96/57 mmHg  Pulse 61  Temp(Src) 98.7 F (37.1 C) (Oral)  Ht 5\' 6"  (1.676 m)  Wt 125 lb (56.7 kg)  BMI 20.19 kg/m2  SpO2 100%  LMP 11/11/2015 No data found.   Physical Exam  Constitutional: She is oriented to person, place, and time. She appears well-developed. No distress.  HENT:  Head: Head is without raccoon's eyes, without Battle's sign, without abrasion and without right periorbital erythema.    Right Ear: Tympanic membrane, external ear and ear canal normal.  Left Ear: Tympanic membrane, external ear and ear canal normal.  Nose: Nose normal.  Mouth/Throat: Oropharynx is clear and moist.  There is tenderness to palpation right temporal, zygomatic, and maxillary area as noted on diagram.  No bony step-offs palpated.    Eyes: Conjunctivae and EOM are normal. Pupils are equal, round, and reactive to light.  Neck: Normal range of motion. Neck supple.  Cardiovascular: Normal heart sounds.   Pulmonary/Chest: Breath sounds normal.  Lymphadenopathy:    She has no cervical adenopathy.  Neurological: She is alert and oriented to person, place, and time. She has normal reflexes. No cranial nerve deficit. Coordination normal.  Skin: Skin is warm and dry.  Nursing note and vitals reviewed.   ED Course  Procedures none   Visual Acuity Review  Right Eye Distance: 20/30 Left Eye Distance: 20/40 Bilateral Distance: 20/30    MDM   1. Head injury, subsequent encounter; suspect mild concussion    Rule out occult fracture Schedule CT scan head w/o contrast tomorrow. Continue to apply ice pack for 10 to 15 minutes, every 2 to 3 hours.  May take Tylenol as needed for pain. If symptoms become significantly worse  during the night or over the weekend, proceed to the local emergency room.     Lattie HawStephen A Yago Ludvigsen, MD 11/24/15 1800

## 2015-11-24 NOTE — Discharge Instructions (Signed)
Continue to apply ice pack for 10 to 15 minutes, every 2 to 3 hours.  May take Tylenol as needed for pain. If symptoms become significantly worse during the night or over the weekend, proceed to the local emergency room.

## 2015-11-25 ENCOUNTER — Telehealth: Payer: Self-pay | Admitting: *Deleted

## 2015-11-25 ENCOUNTER — Ambulatory Visit (INDEPENDENT_AMBULATORY_CARE_PROVIDER_SITE_OTHER): Payer: Self-pay

## 2015-11-25 DIAGNOSIS — W1830XA Fall on same level, unspecified, initial encounter: Secondary | ICD-10-CM

## 2015-11-25 DIAGNOSIS — S0990XA Unspecified injury of head, initial encounter: Secondary | ICD-10-CM

## 2015-11-25 NOTE — ED Notes (Unsigned)
Pt called CT scan results given. She is concerned about the jaw pain that she is having. She wants to know if her jaw was visualized on the CT scan. She states that her jaw pain and stiffness was her main concern. Please advise.

## 2015-11-25 NOTE — ED Notes (Unsigned)
Encounter created to input CT head order.

## 2015-11-26 ENCOUNTER — Telehealth: Payer: Self-pay | Admitting: *Deleted

## 2015-11-26 NOTE — ED Notes (Unsigned)
Spoke to pt per Dr Cathren HarshBeese her CT did not show any broken bones, she should f/u with ENT if pain persist. #'s given to ENT and pt will call to schedule f/u if she is not improving by the end of the week. Clemens Catholichristy Sharmeka Palmisano, LPN ]

## 2016-02-04 ENCOUNTER — Encounter (HOSPITAL_COMMUNITY): Payer: Self-pay | Admitting: *Deleted

## 2016-02-04 ENCOUNTER — Emergency Department (HOSPITAL_COMMUNITY)
Admission: EM | Admit: 2016-02-04 | Discharge: 2016-02-04 | Disposition: A | Discharge status: Home / Self Care | Payer: Medicaid Other | Attending: Emergency Medicine | Admitting: Emergency Medicine

## 2016-02-04 DIAGNOSIS — Y999 Unspecified external cause status: Secondary | ICD-10-CM | POA: Insufficient documentation

## 2016-02-04 DIAGNOSIS — Y939 Activity, unspecified: Secondary | ICD-10-CM | POA: Insufficient documentation

## 2016-02-04 DIAGNOSIS — J45909 Unspecified asthma, uncomplicated: Secondary | ICD-10-CM | POA: Insufficient documentation

## 2016-02-04 DIAGNOSIS — X58XXXA Exposure to other specified factors, initial encounter: Secondary | ICD-10-CM | POA: Insufficient documentation

## 2016-02-04 DIAGNOSIS — M5412 Radiculopathy, cervical region: Secondary | ICD-10-CM

## 2016-02-04 DIAGNOSIS — Z79899 Other long term (current) drug therapy: Secondary | ICD-10-CM | POA: Insufficient documentation

## 2016-02-04 DIAGNOSIS — Y929 Unspecified place or not applicable: Secondary | ICD-10-CM | POA: Insufficient documentation

## 2016-02-04 DIAGNOSIS — M25552 Pain in left hip: Secondary | ICD-10-CM

## 2016-02-04 LAB — I-STAT BETA HCG BLOOD, ED (MC, WL, AP ONLY): I-stat hCG, quantitative: <5 m[IU]/mL (ref <5)

## 2016-02-04 MED ORDER — HYDROCODONE-ACETAMINOPHEN 5-325 MG PO TABS: 1.0000 | ORAL_TABLET | ORAL | 0 refills | Status: DC | PRN

## 2016-02-04 MED ORDER — NAPROXEN 500 MG PO TABS: 500.0000 mg | ORAL_TABLET | Freq: Two times a day (BID) | ORAL | 0 refills | Status: DC

## 2016-02-04 MED ORDER — PREDNISONE 20 MG PO TABS: 40.0000 mg | ORAL_TABLET | Freq: Every day | ORAL | 0 refills | Status: DC

## 2016-02-04 MED ORDER — NAPROXEN 250 MG PO TABS
500.0000 mg | ORAL_TABLET | Freq: Once | ORAL | Status: AC
  Administered 2016-02-04: 500 mg via ORAL
  Filled 2016-02-04: qty 2

## 2016-02-04 MED ORDER — CYCLOBENZAPRINE HCL 10 MG PO TABS: 10.0000 mg | ORAL_TABLET | Freq: Two times a day (BID) | ORAL | 0 refills | Status: DC | PRN

## 2016-02-04 NOTE — ED Notes (Signed)
Declined W/C at D/C and was escorted to lobby by RN. 

## 2016-02-04 NOTE — ED Provider Notes (Signed)
[facility]-EMERGENCY DEPT Provider Note   CSN: [id number] Arrival date & time: 02/04/16  1517 By signing my name below, I, Bridgette HabermannMaria Tan, attest that this documentation has been prepared under the direction and in the presence of Malick Netz, [place]. Electronically Signed: Bridgette HabermannMaria Tan, ED Scribe. 02/04/16. 4:40 PM.  History   Chief Complaint Chief Complaint  Patient presents with  . Arm Pain   HPI Comments: Allison JuneauKimberly N Flowers is a 40 y.o. female who presents to the Emergency Department complaining of gradual onset, intermittent, throbbing, aching, 7/10 neck pain and right arm onset one week ago. Pt notes that she feels like her right arm and neck is "falling asleep" in certain positions. Pt is also complaining of intermittent left hip pain for the past couple years since giving birth. She states she feels the joint popping often and is concerned for arthritis. Pain is exacerbated with movement. Pt states that her blood pressure has been higher than it regularly is. Pt has taken an Ibuprofen with mild relief. Pt denies fever.  Denies new weakness. Denies known injury or trauma.   The history is provided by the patient. No language interpreter was used.    Past Medical History:  Diagnosis Date  . Asthma 02/19/2012  . 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 History of abnormal Pap smear 02/19/2012   2002; had colpo's and questionable LEEP  . History of induced abortion 02/19/2012   EAB x3 w/ G1 ('00), G3 ('05), G4 ('06)  . Missed abortion 02/19/2012  . PONV (postoperative nausea and vomiting)   . Reflux   . SVD (spontaneous vaginal delivery)    x 3    Patient Active Problem List   Diagnosis Date Noted  . Missed abortion 02/19/2012  . History of induced abortion 02/19/2012  . 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 Asthma 02/19/2012  . History of abnormal Pap smear 02/19/2012  . Acid reflux 12/31/2011  . Foot pain, left 11/21/2010    Past Surgical History:  Procedure Laterality Date  . DILATION AND EVACUATION  02/19/2012    Procedure: DILATATION AND EVACUATION;  Surgeon: Michael LitterNaima A Dillard, MD;  Location: [facility];  Service: Gynecology;  Laterality: N/A;  . WISDOM TOOTH EXTRACTION      OB History    Gravida Para Term Preterm AB Living   9 3 3   4 3    SAB TAB Ectopic Multiple Live Births   1 3             Home Medications    Prior to Admission medications   Medication Sig Start Date End Date Taking? Authorizing Provider  cetirizine (ZYRTEC) 10 MG tablet Take 10 mg by mouth daily as needed for allergies.    Historical Provider, MD  fluconazole (DIFLUCAN) 150 MG tablet Take 1 tablet now, and 1 tablet in 1 week after your antibiotic course. Patient not taking: Reported on 09/13/2014 09/08/14   Misty StanleyLisa A Leftwich-Kirby, CNM  ibuprofen (ADVIL,MOTRIN) 200 MG tablet Take 400 mg by mouth every 6 (six) hours as needed.    Historical Provider, MD  norgestimate-ethinyl estradiol (ORTHO-CYCLEN,SPRINTEC,PREVIFEM) 0.25-35 MG-MCG tablet Take 1 tablet by mouth daily. 09/13/14   Aviva SignsMarie L Williams, CNM  phenazopyridine (PYRIDIUM) 200 MG tablet Take 1 tablet (200 mg total) by mouth 3 (three) times daily as needed for pain. Patient not taking: Reported on 09/13/2014 09/08/14   Wilmer FloorLisa A Leftwich-Kirby, CNM  Phenylephrine-APAP-Guaifenesin 5-325-200 MG TABS Take by mouth.    Historical Provider, MD  Prenatal Vit-Min-FA-Fish  Oil ([employer] PRENATAL GUMMY PO) Take 2 each by mouth daily.    Historical Provider, MD  sulfamethoxazole-trimethoprim (BACTRIM DS,SEPTRA DS) 800-160 MG per tablet Take 1 tablet by mouth 2 (two) times daily. 09/08/14   Hurshel Party, CNM    Family History Family History  Problem Relation Age of Onset  . Cancer Maternal Grandmother   . Diabetes Maternal Grandfather     Social History Social History  Substance Use Topics  . Smoking status: Never Smoker  . Smokeless tobacco: Never Used  . Alcohol use No     Allergies   Penicillins and Amoxil [amoxicillin]   Review of Systems Review of Systems 10 Systems  reviewed and all are negative for acute change except as noted in the HPI. Physical Exam Updated Vital Signs BP 113/71 (BP Location: Left Arm)   Pulse 64   Temp 98 F (36.7 C) (Oral)   Resp 22   SpO2 100%   Physical Exam  Constitutional: She appears well-developed and well-nourished.  HENT:  Head: Normocephalic.  Eyes: Conjunctivae are normal.  Cardiovascular: Normal rate.   Pulmonary/Chest: Effort normal. No respiratory distress.  Abdominal: She exhibits no distension.  Musculoskeletal: Normal range of motion. She exhibits tenderness. She exhibits no deformity.  No C-spine, T-spine, or L-spine tenderness. Minimal tenderness around trapezius. No arm tenderness. Bilateral upper extremities with full ROM. Strength and sensation intact. Left hip non tender, no deformity, full ROM, without laxity or crepitus.  Neurological: She is alert.  Skin: Skin is warm and dry.  Psychiatric: Her behavior is normal. Her mood appears anxious.  Nursing note and vitals reviewed.    ED Treatments / Results  DIAGNOSTIC STUDIES: Oxygen Saturation is 100% on RA, normal by my interpretation.    COORDINATION OF CARE: 4:40 PM Discussed treatment plan with pt at bedside which includes Rx and orthopedic referral and pt agreed to plan.  Labs (all labs ordered are listed, but only abnormal results are displayed) Labs Reviewed  I-STAT BETA HCG BLOOD, ED ([facility], [facility], [facility] ONLY)    EKG  EKG Interpretation None       Radiology No results found.  Procedures Procedures (including critical care time)  Medications Ordered in ED Medications - No data to display   Initial Impression / Assessment and Plan / ED Course  I have reviewed the triage vital signs and the nursing notes.  Pertinent labs & imaging results that were available during my care of the patient were reviewed by me and considered in my medical decision making (see chart for details).  Clinical Course    Originally ordered DG  pelvis/left hip per pt request but pt ultimately could not wait for x-ray to be done. Left hip pain is intermittent and chronic. Exam nonfocal today. Her pain in her neck and arm seem radicular. Neuro exam intact. Will treat with course of NSAIDs, steroids, and muscle relaxer. Encouraged ortho f/u when possible. ER return precautions given.  Final Clinical Impressions(s) / ED Diagnoses   Final diagnoses:  Cervical radiculopathy  Left hip pain    New Prescriptions Discharge Medication List as of 02/04/2016  6:09 PM    START taking these medications   Details  cyclobenzaprine (FLEXERIL) 10 MG tablet Take 1 tablet (10 mg total) by mouth 2 (two) times daily as needed for muscle spasms., Starting Tue 02/04/2016, Print    HYDROcodone-acetaminophen (NORCO/VICODIN) 5-325 MG tablet Take 1 tablet by mouth every 4 (four) hours as needed for severe pain., Starting Tue 02/04/2016, Print  naproxen (NAPROSYN) 500 MG tablet Take 1 tablet (500 mg total) by mouth 2 (two) times daily., Starting Tue 02/04/2016, Print    predniSONE (DELTASONE) 20 MG tablet Take 2 tablets (40 mg total) by mouth daily., Starting Tue 02/04/2016, Print      I personally performed the services described in this documentation, which was scribed in my presence. The recorded information has been reviewed and is accurate.    Carlene Coria, PA-C 02/04/16 1914    Mancel Bale, MD 02/05/16 [phone]

## 2016-02-04 NOTE — ED Triage Notes (Signed)
Pt reports throbbing right arm pain and right neck pain for a week. Pt states certain positions her right arm and neck will start to feel like it is falling asleep.

## 2016-02-04 NOTE — Discharge Instructions (Signed)
Take medication as prescribed. Follow up with ortho when possible.

## 2016-05-15 ENCOUNTER — Encounter (HOSPITAL_COMMUNITY): Payer: Self-pay | Admitting: Emergency Medicine

## 2016-05-15 ENCOUNTER — Ambulatory Visit (HOSPITAL_COMMUNITY)
Admission: EM | Admit: 2016-05-15 | Discharge: 2016-05-15 | Disposition: A | Discharge status: Home / Self Care | Payer: Self-pay | Attending: Internal Medicine | Admitting: Internal Medicine

## 2016-05-15 DIAGNOSIS — R05 Cough: Secondary | ICD-10-CM

## 2016-05-15 DIAGNOSIS — Z880 Allergy status to penicillin: Secondary | ICD-10-CM | POA: Insufficient documentation

## 2016-05-15 DIAGNOSIS — J45909 Unspecified asthma, uncomplicated: Secondary | ICD-10-CM | POA: Insufficient documentation

## 2016-05-15 DIAGNOSIS — Z9889 Other specified postprocedural states: Secondary | ICD-10-CM | POA: Insufficient documentation

## 2016-05-15 DIAGNOSIS — K219 Gastro-esophageal reflux disease without esophagitis: Secondary | ICD-10-CM | POA: Insufficient documentation

## 2016-05-15 DIAGNOSIS — R059 Cough, unspecified: Secondary | ICD-10-CM

## 2016-05-15 DIAGNOSIS — J029 Acute pharyngitis, unspecified: Secondary | ICD-10-CM

## 2016-05-15 DIAGNOSIS — R0982 Postnasal drip: Secondary | ICD-10-CM

## 2016-05-15 DIAGNOSIS — Z79899 Other long term (current) drug therapy: Secondary | ICD-10-CM | POA: Insufficient documentation

## 2016-05-15 LAB — POCT RAPID STREP A: Streptococcus, Group A Screen (Direct): NEGATIVE

## 2016-05-15 NOTE — ED Provider Notes (Signed)
CSN: [id number]     Arrival date & time 05/15/16  1648 History   First MD Initiated Contact with Patient 05/15/16 1712     Chief Complaint  Patient presents with  . Sore Throat   (Consider location/radiation/quality/duration/timing/severity/associated sxs/prior Treatment) 40 yo presents with a mild cough that began yesterday. Some yellow phlegm. She awoke today with a sore throat and wanted to get checked for strep, as she was exposed yesterday. She has no fever or chills. No N, V. Just reports not feeling well.       Past Medical History:  Diagnosis Date  . Asthma 02/19/2012  . 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 History of abnormal Pap smear 02/19/2012   2002; had colpo's and questionable LEEP  . History of induced abortion 02/19/2012   EAB x3 w/ G1 ('00), G3 ('05), G4 ('06)  . Missed abortion 02/19/2012  . PONV (postoperative nausea and vomiting)   . Reflux   . SVD (spontaneous vaginal delivery)    x 3   Past Surgical History:  Procedure Laterality Date  . DILATION AND EVACUATION  02/19/2012   Procedure: DILATATION AND EVACUATION;  Surgeon: Michael LitterNaima A Dillard, MD;  Location: [facility];  Service: Gynecology;  Laterality: N/A;  . WISDOM TOOTH EXTRACTION     Family History  Problem Relation Age of Onset  . Cancer Maternal Grandmother   . Diabetes Maternal Grandfather    Social History  Substance Use Topics  . Smoking status: Never Smoker  . Smokeless tobacco: Never Used  . Alcohol use No   OB History    Gravida Para Term Preterm AB Living   9 3 3   4 3    SAB TAB Ectopic Multiple Live Births   1 3           Review of Systems  Constitutional: Positive for fatigue. Negative for chills and fever.  HENT: Positive for congestion, sore throat and voice change.   Eyes: Negative.   Respiratory: Positive for cough. Negative for shortness of breath.   Musculoskeletal: Negative.     Allergies  Penicillins and Amoxil [amoxicillin]  Home Medications   Prior to Admission  medications   Medication Sig Start Date End Date Taking? Authorizing Provider  cetirizine (ZYRTEC) 10 MG tablet Take 10 mg by mouth daily as needed for allergies.   Yes Historical Provider, MD  Phenylephrine-APAP-Guaifenesin 5-325-200 MG TABS Take by mouth.   Yes Historical Provider, MD  cyclobenzaprine (FLEXERIL) 10 MG tablet Take 1 tablet (10 mg total) by mouth 2 (two) times daily as needed for muscle spasms. 02/04/16   Ace GinsSerena Y Sam, PA-C  fluconazole (DIFLUCAN) 150 MG tablet Take 1 tablet now, and 1 tablet in 1 week after your antibiotic course. Patient not taking: Reported on 09/13/2014 09/08/14   Wilmer FloorLisa A Leftwich-Kirby, CNM  HYDROcodone-acetaminophen (NORCO/VICODIN) 5-325 MG tablet Take 1 tablet by mouth every 4 (four) hours as needed for severe pain. 02/04/16   Ace GinsSerena Y Sam, PA-C  ibuprofen (ADVIL,MOTRIN) 200 MG tablet Take 400 mg by mouth every 6 (six) hours as needed.    Historical Provider, MD  naproxen (NAPROSYN) 500 MG tablet Take 1 tablet (500 mg total) by mouth 2 (two) times daily. 02/04/16   Ace GinsSerena Y Sam, PA-C  norgestimate-ethinyl estradiol (ORTHO-CYCLEN,SPRINTEC,PREVIFEM) 0.25-35 MG-MCG tablet Take 1 tablet by mouth daily. 09/13/14   Aviva SignsMarie L Williams, CNM  phenazopyridine (PYRIDIUM) 200 MG tablet Take 1 tablet (200 mg total) by mouth 3 (three) times daily as needed  for pain. Patient not taking: Reported on 09/13/2014 09/08/14   Wilmer FloorLisa A Leftwich-Kirby, CNM  predniSONE (DELTASONE) 20 MG tablet Take 2 tablets (40 mg total) by mouth daily. 02/04/16   Carlene CoriaSerena Y Sam, PA-C  Prenatal Vit-Min-FA-Fish Oil ([employer] PRENATAL GUMMY PO) Take 2 each by mouth daily.    Historical Provider, MD  sulfamethoxazole-trimethoprim (BACTRIM DS,SEPTRA DS) 800-160 MG per tablet Take 1 tablet by mouth 2 (two) times daily. 09/08/14   Wilmer FloorLisa A Leftwich-Kirby, CNM   Meds Ordered and Administered this Visit  Medications - No data to display  BP 107/70 (BP Location: Left Arm)   Pulse 72   Temp 98.6 F (37 C) (Oral)   Resp 16   LMP  04/27/2016 (Exact Date)   SpO2 100%  No data found.   Physical Exam  Constitutional: She is oriented to person, place, and time. She appears well-developed and well-nourished. No distress.  HENT:  Head: Normocephalic and atraumatic.  Right Ear: External ear normal.  Left Ear: External ear normal.  Mouth/Throat: No oropharyngeal exudate.  Mild oropharynx injection without exudate or tonsillar enlargement  Cardiovascular: Normal rate and regular rhythm.   Pulmonary/Chest: Effort normal and breath sounds normal. No respiratory distress. She has no wheezes.  Lymphadenopathy:    She has no cervical adenopathy.  Neurological: She is alert and oriented to person, place, and time.  Skin: She is not diaphoretic.  Psychiatric: Her behavior is normal.  Nursing note and vitals reviewed.   Urgent Care Course   Clinical Course     Procedures (including critical care time)  Labs Review Labs Reviewed  CULTURE, GROUP A STREP [facility])  POCT RAPID STREP A    Imaging Review No results found.   Visual Acuity Review  Right Eye Distance:   Left Eye Distance:   Bilateral Distance:    Right Eye Near:   Left Eye Near:    Bilateral Near:         MDM   1. Viral pharyngitis   2. Cough   3. Post-nasal drip    No indication for antibiotics at this time. Treat symptoms with OTC remedies as described in AVS. Push fluids and rest. F/U if needed.     Riki SheerMichelle G Young, PA-C 05/15/16 2038

## 2016-05-15 NOTE — Discharge Instructions (Signed)
You appear to have a viral upper respiratory infection. Strep was negative, but will send down for final culture. For nasal drip suggest use of Flonase (over the counter nasal spray), Motrin 800mg  every 8 hours for overall inflammation from your virus, Mucinex DM Maximum strength, but must take with 4 oz of water to be effective. F/U if worsens. Feels better.

## 2016-05-15 NOTE — ED Triage Notes (Signed)
[facility] a [facility] of a cough and sore throat that started yesterday. The patient denied any fevers.

## 2016-05-18 LAB — CULTURE, GROUP A STREP (THRC)

## 2017-01-18 ENCOUNTER — Encounter (HOSPITAL_COMMUNITY): Payer: Self-pay | Admitting: Family Medicine

## 2017-01-18 ENCOUNTER — Ambulatory Visit (HOSPITAL_COMMUNITY)
Admission: EM | Admit: 2017-01-18 | Discharge: 2017-01-18 | Disposition: A | Discharge status: Home / Self Care | Payer: Medicaid Other | Attending: Family Medicine | Admitting: Family Medicine

## 2017-01-18 DIAGNOSIS — M436 Torticollis: Secondary | ICD-10-CM

## 2017-01-18 DIAGNOSIS — M542 Cervicalgia: Secondary | ICD-10-CM

## 2017-01-18 MED ORDER — PREDNISONE 20 MG PO TABS: ORAL_TABLET | ORAL | 0 refills | Status: DC

## 2017-01-18 MED ORDER — METAXALONE 800 MG PO TABS: 800.0000 mg | ORAL_TABLET | Freq: Three times a day (TID) | ORAL | 0 refills | Status: DC

## 2017-01-18 NOTE — ED Triage Notes (Signed)
Pt woke up Saturday morning with pain in the back of her neck.  She has some redness and swelling on the back of her neck.  She has taken Naproxen and a muscle relaxer with no relief. She has also had some massage therapy.  Pt reports having a lot of stress lately.

## 2017-01-18 NOTE — ED Provider Notes (Signed)
[facility]    CSN: [id number] Arrival date & time: 01/18/17  1121     History   Chief Complaint Chief Complaint  Patient presents with  . Neck Pain    HPI [name] is a 41 y.o. female.   This is a 41 year old woman comes in for evaluation of neck pain.  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  Patient woke up on Saturday morning with stiff neck which has gradually gotten a little bit better over the weekend but is still quite painful. She has taken Naprosyn and Flexeril for medications but they have a not done 2 allow her to go back to work.      Past Medical History:  Diagnosis Date  . Asthma 02/19/2012  . 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 History of abnormal Pap smear 02/19/2012   2002; had colpo's and questionable LEEP  . History of induced abortion 02/19/2012   EAB x3 w/ G1 ('00), G3 ('05), G4 ('06)  . Missed abortion 02/19/2012  . PONV (postoperative nausea and vomiting)   . Reflux   . SVD (spontaneous vaginal delivery)    x 3    Patient Active Problem List   Diagnosis Date Noted  . Missed abortion 02/19/2012  . History of induced abortion 02/19/2012  . 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 Asthma 02/19/2012  . History of abnormal Pap smear 02/19/2012  . Acid reflux 12/31/2011  . Foot pain, left 11/21/2010    Past Surgical History:  Procedure Laterality Date  . DILATION AND EVACUATION  02/19/2012   Procedure: DILATATION AND EVACUATION;  Surgeon: Michael Litter, MD;  Location: [facility];  Service: Gynecology;  Laterality: N/A;  . WISDOM TOOTH EXTRACTION      OB History    Gravida Para Term Preterm AB Living   9 3 3   4 3    SAB TAB Ectopic Multiple Live Births   1 3             Home Medications    Prior to Admission medications   Medication Sig Start Date End Date Taking? Authorizing Provider  cetirizine (ZYRTEC) 10 MG tablet Take 10 mg by mouth daily as needed for allergies.   Yes [provider]  HYDROcodone-acetaminophen  (NORCO/VICODIN) 5-325 MG tablet Take 1 tablet by mouth every 4 (four) hours as needed for severe pain. 02/04/16  Yes Sam, Serena Y, PA-C  metaxalone (SKELAXIN) 800 MG tablet Take 1 tablet (800 mg total) by mouth 3 (three) times daily. 01/18/17   Elvina Sidle, MD  norgestimate-ethinyl estradiol (ORTHO-CYCLEN,SPRINTEC,PREVIFEM) 0.25-35 MG-MCG tablet Take 1 tablet by mouth daily. 09/13/14   Aviva Signs, CNM  predniSONE (DELTASONE) 20 MG tablet Two daily with food 01/18/17   Elvina Sidle, MD    Family History Family History  Problem Relation Age of Onset  . Cancer Maternal Grandmother   . Diabetes Maternal Grandfather     Social History Social History  Substance Use Topics  . Smoking status: Never Smoker  . Smokeless tobacco: Never Used  . Alcohol use No     Allergies   Penicillins and Amoxil [amoxicillin]   Review of Systems Review of Systems   Physical Exam Triage Vital Signs ED Triage Vitals  Enc Vitals Group     BP      Pulse      Resp      Temp      Temp src      SpO2  Weight      Height      Head Circumference      Peak Flow      Pain Score      Pain Loc      Pain Edu?      Excl. in GC?    No data found.   Updated Vital Signs LMP 01/05/2017 (Exact Date)   Visual Acuity Right Eye Distance:   Left Eye Distance:   Bilateral Distance:    Right Eye Near:   Left Eye Near:    Bilateral Near:     Physical Exam   UC Treatments / Results  Labs (all labs ordered are listed, but only abnormal results are displayed) Labs Reviewed - No data to display  EKG  EKG Interpretation None       Radiology No results found.  Procedures Procedures (including critical care time)  Medications Ordered in UC Medications - No data to display   Initial Impression / Assessment and Plan / UC Course  I have reviewed the triage vital signs and the nursing notes.  Pertinent labs & imaging results that were available during my care of the patient  were reviewed by me and considered in my medical decision making (see chart for details).     Final Clinical Impressions(s) / UC Diagnoses   Final diagnoses:  Torticollis, acute    New Prescriptions New Prescriptions   METAXALONE (SKELAXIN) 800 MG TABLET    Take 1 tablet (800 mg total) by mouth 3 (three) times daily.   PREDNISONE (DELTASONE) 20 MG TABLET    Two daily with food     Controlled Substance Prescriptions [place] Controlled Substance Registry consulted? Not Applicable   Elvina Sidle, MD 01/18/17 1243

## 2017-02-22 ENCOUNTER — Ambulatory Visit (HOSPITAL_COMMUNITY)
Admission: EM | Admit: 2017-02-22 | Discharge: 2017-02-22 | Disposition: A | Discharge status: Home / Self Care | Payer: Medicaid Other | Attending: Family Medicine | Admitting: Family Medicine

## 2017-02-22 ENCOUNTER — Encounter (HOSPITAL_COMMUNITY): Payer: Self-pay | Admitting: Emergency Medicine

## 2017-02-22 DIAGNOSIS — R21 Rash and other nonspecific skin eruption: Secondary | ICD-10-CM

## 2017-02-22 DIAGNOSIS — J020 Streptococcal pharyngitis: Secondary | ICD-10-CM

## 2017-02-22 LAB — POCT RAPID STREP A: Streptococcus, Group A Screen (Direct): POSITIVE — AB

## 2017-02-22 MED ORDER — TRIAMCINOLONE ACETONIDE 0.1 % EX CREA: 1.0000 "application " | TOPICAL_CREAM | Freq: Two times a day (BID) | CUTANEOUS | 0 refills | Status: DC

## 2017-02-22 MED ORDER — AZITHROMYCIN 500 MG PO TABS: 500.0000 mg | ORAL_TABLET | Freq: Every day | ORAL | 0 refills | Status: AC

## 2017-02-22 MED ORDER — AZITHROMYCIN 500 MG PO TABS: 500.0000 mg | ORAL_TABLET | Freq: Every day | ORAL | 0 refills | Status: DC

## 2017-02-22 MED ORDER — TRIAMCINOLONE ACETONIDE 0.1 % EX CREA: 1.0000 "application " | TOPICAL_CREAM | Freq: Two times a day (BID) | CUTANEOUS | 0 refills | Status: AC

## 2017-02-22 NOTE — ED Triage Notes (Addendum)
Thursday evening her throat started hurting.  Now patient has multiple insect bites to torso and extremities.    Over the past month has had issues with leak in apartment and mold has been found.  Patient also has been assisting with kittens that had skin issues and her children have similar skin complaints to hers.  Patient repeatedly stresses the mold in apartment.  Patient is also a Administrator, sports

## 2017-02-22 NOTE — ED Provider Notes (Signed)
[facility]    CSN: [id number] Arrival date & time: 02/22/17  1007     History   Chief Complaint Chief Complaint  Patient presents with  . URI    HPI [name] is a 41 y.o. female.   41 year old female with history of asthma comes in for four-day history of rash on her body. Patient states she was in an apartment that was found to have mold, and had one night where the tarp covering the mold fell down, and she is worried with AC circulation, could be causing her symptoms. She has had a sore throat since 4 days ago with nasal congestion. Denies fever, chills, night sweats. Denies cough, chest pain, shortness of breath, wheezing. She states her sons and daughters have developed rashes as well. They have recently moved out of the mold infested apartment. Rash is itchy in nature, and spreads throughout upper/lower extremity, as well as the trunk. She has a kitten, that had skin issues, but has since been treated and is healing well. Patient states that she is sure her pets do not have fleas, and does not think the source of the rash is from the pets. She took left over prednisone, which helped with the symptoms. Denies other new exposures. 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       Past Medical History:  Diagnosis Date  . Asthma 02/19/2012  . 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 History of abnormal Pap smear 02/19/2012   2002; had colpo's and questionable LEEP  . History of induced abortion 02/19/2012   EAB x3 w/ G1 ('00), G3 ('05), G4 ('06)  . Missed abortion 02/19/2012  . PONV (postoperative nausea and vomiting)   . Reflux   . SVD (spontaneous vaginal delivery)    x 3    Patient Active Problem List   Diagnosis Date Noted  . Missed abortion 02/19/2012  . History of induced abortion 02/19/2012  . 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 Asthma 02/19/2012  . History of abnormal Pap smear 02/19/2012  . Acid reflux 12/31/2011  . Foot pain,  left 11/21/2010    Past Surgical History:  Procedure Laterality Date  . DILATION AND EVACUATION  02/19/2012   Procedure: DILATATION AND EVACUATION;  Surgeon: Michael Litter, MD;  Location: [facility];  Service: Gynecology;  Laterality: N/A;  . WISDOM TOOTH EXTRACTION      OB History    Gravida Para Term Preterm AB Living   SAB TAB Ectopic Multiple Live Births   1 3             Home Medications    Prior to Admission medications   Medication Sig Start Date End Date Taking? Authorizing Provider  azithromycin (ZITHROMAX) 500 MG tablet Take 1 tablet (500 mg total) by mouth daily. 02/22/17 02/27/17  Cathie Hoops, Koden Hunzeker V, PA-C  triamcinolone cream (KENALOG) 0.1 % Apply 1 application topically 2 (two) times daily. 02/22/17   Belinda Fisher, PA-C    Family History Family History  Problem Relation Age of Onset  . Cancer Maternal Grandmother   . Diabetes Maternal Grandfather     Social History Social History  Substance Use Topics  . Smoking status: Never Smoker  . Smokeless tobacco: Never Used  . Alcohol use No     Allergies   Penicillins and Amoxil [amoxicillin]   Review of Systems Review of  Systems  Reason unable to perform ROS: See HPI as above.     Physical Exam Triage Vital Signs ED Triage Vitals [02/22/17 1103]  Enc Vitals Group     BP 104/70     Pulse Rate 68     Resp 18     Temp 98.5 F (36.9 C)     Temp Source Oral     SpO2 100 %     Weight      Height      Head Circumference      Peak Flow      Pain Score      Pain Loc      Pain Edu?      Excl. in GC?    No data found.   Updated Vital Signs BP 104/70 (BP Location: Right Arm)   Pulse 68   Temp 98.5 F (36.9 C) (Oral)   Resp 18   LMP 02/03/2017   SpO2 100%   Physical Exam  Constitutional: She is oriented to person, place, and time. She appears well-developed and well-nourished. No distress.  HENT:  Head: Normocephalic and atraumatic.  Right Ear: External ear and ear canal normal. Tympanic  membrane is not erythematous and not bulging.  Left Ear: External ear and ear canal normal. Tympanic membrane is not erythematous and not bulging.  Nose: Rhinorrhea present. Right sinus exhibits no maxillary sinus tenderness and no frontal sinus tenderness. Left sinus exhibits no maxillary sinus tenderness and no frontal sinus tenderness.  Mouth/Throat: Uvula is midline and mucous membranes are normal. Posterior oropharyngeal erythema present.  Opaque TM bilaterally.   Eyes: Pupils are equal, round, and reactive to light. Conjunctivae are normal.  Neck: Normal range of motion. Neck supple.  Cardiovascular: Normal rate, regular rhythm and normal heart sounds.  Exam reveals no gallop and no friction rub.   No murmur heard. Pulmonary/Chest: Effort normal and breath sounds normal. She has no decreased breath sounds. She has no wheezes. She has no rhonchi. She has no rales.  Lymphadenopathy:    She has no cervical adenopathy.  Neurological: She is alert and oriented to person, place, and time.  Skin: Skin is warm and dry.  See pictures below.  Psychiatric: She has a normal mood and affect. Her behavior is normal. Judgment normal.           UC Treatments / Results  Labs (all labs ordered are listed, but only abnormal results are displayed) Labs Reviewed  POCT RAPID STREP A - Abnormal; Notable for the following:       Result Value   Streptococcus, Group A Screen (Direct) POSITIVE (*)    All other components within normal limits    EKG  EKG Interpretation None       Radiology No results found.  Procedures Procedures (including critical care time)  Medications Ordered in UC Medications - No data to display   Initial Impression / Assessment and Plan / UC Course  I have reviewed the triage vital signs and the nursing notes.  Pertinent labs & imaging results that were available during my care of the patient were reviewed by me and considered in my medical decision making  (see chart for details).    Rapid strep positive. Start antibiotic as directed. Triamcinolone cream for rash. Patient to continue to monitor for new exposures. Return precautions given.    Final Clinical Impressions(s) / UC Diagnoses   Final diagnoses:  Strep pharyngitis  Rash    New Prescriptions Discharge Medication  List as of 02/22/2017 12:04 PM    START taking these medications   Details  azithromycin (ZITHROMAX) 500 MG tablet Take 1 tablet (500 mg total) by mouth daily., Starting Mon 02/22/2017, Until Sat 02/27/2017, Normal    triamcinolone cream (KENALOG) 0.1 % Apply 1 application topically 2 (two) times daily., Starting Mon 02/22/2017, Normal          Linward Headland V, PA-C 02/22/17 1226

## 2017-02-22 NOTE — Discharge Instructions (Signed)
Rapid strep positive. Start antibiotics as directed. Tylenol/Motrin for fever and pain. Discard toothbrush after 24 hours on antibiotics. Use triamcinolone cream on rash. Take zyrtec for itchiness. Monitor for new exposure/changes. Monitor for any worsening of symptoms, trouble breathing, trouble swallowing, swelling of the throat, follow up here or at the emergency department for reevaluation.

## 2017-03-01 ENCOUNTER — Ambulatory Visit (HOSPITAL_COMMUNITY)
Admission: EM | Admit: 2017-03-01 | Discharge: 2017-03-01 | Disposition: A | Discharge status: Home / Self Care | Payer: Medicaid Other | Attending: Internal Medicine | Admitting: Internal Medicine

## 2017-03-01 ENCOUNTER — Encounter (HOSPITAL_COMMUNITY): Payer: Self-pay | Admitting: *Deleted

## 2017-03-01 DIAGNOSIS — T148XXA Other injury of unspecified body region, initial encounter: Secondary | ICD-10-CM

## 2017-03-01 DIAGNOSIS — S39012A Strain of muscle, fascia and tendon of lower back, initial encounter: Secondary | ICD-10-CM

## 2017-03-01 DIAGNOSIS — R599 Enlarged lymph nodes, unspecified: Secondary | ICD-10-CM

## 2017-03-01 DIAGNOSIS — J020 Streptococcal pharyngitis: Secondary | ICD-10-CM

## 2017-03-01 LAB — POCT RAPID STREP A: Streptococcus, Group A Screen (Direct): POSITIVE — AB

## 2017-03-01 MED ORDER — CLINDAMYCIN HCL 300 MG PO CAPS: 300.0000 mg | ORAL_CAPSULE | Freq: Three times a day (TID) | ORAL | 0 refills | Status: AC

## 2017-03-01 NOTE — Discharge Instructions (Signed)
Rapid strep positive. Start antibiotics as directed. Tylenol/Motrin for fever and pain. Discard toothbrush after 24 hours on antibiotics. Monitor for any worsening of symptoms, trouble breathing, trouble swallowing, swelling of the throat, follow up here or at the emergency department for reevaluation.  Your exam was most consistent with muscle strain. Start ibuprofen 600-800mg  three times a day for 10 days. Ice/heat compresses as needed. This can take up to 3-4 weeks to completely resolve, but you should be feeling better each week. Follow up here or with PCP if symptoms worsen, changes for reevaluation. If experience numbness/tingling of the inner thighs, loss of bladder or bowel control, go to the emergency department for evaluation.

## 2017-03-01 NOTE — ED Provider Notes (Signed)
[facility]    CSN: [id number] Arrival date & time: 03/01/17  1003     History   Chief Complaint Chief Complaint  Patient presents with  . Lymphadenopathy    HPI Allison NOURSE is a 41 y.o. female.   41 year old female comes in for swollen left nodes and low back pain. Patient was seen a week ago and was positive for strep, finished her doses of azithromycin. Patient states symptoms resolved 5 days ago after starting antibiotics. She started noticing neck pain 2 days ago, thought it was due to heavy lifting and moving as she has been doing recently, but then noticed a swollen lymph node. She has also had a low-grade fever, states has not been higher than 100. Denies cough, sore throat, ear pain, eye pain. She has felt a little congested. She has also noticed some low back pain from moving. She saw a chiropractor yesterday without relief.she has been taking ibuprofen on and off for the pain. Denies numbness, tingling, loss of bladder or bowel control.      Past Medical History:  Diagnosis Date  . Asthma 02/19/2012  . 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 History of abnormal Pap smear 02/19/2012   2002; had colpo's and questionable LEEP  . History of induced abortion 02/19/2012   EAB x3 w/ G1 ('00), G3 ('05), G4 ('06)  . Missed abortion 02/19/2012  . PONV (postoperative nausea and vomiting)   . Reflux   . SVD (spontaneous vaginal delivery)    x 3    Patient Active Problem List   Diagnosis Date Noted  . Missed abortion 02/19/2012  . History of induced abortion 02/19/2012  . 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 Asthma 02/19/2012  . History of abnormal Pap smear 02/19/2012  . Acid reflux 12/31/2011  . Foot pain, left 11/21/2010    Past Surgical History:  Procedure Laterality Date  . DILATION AND EVACUATION  02/19/2012   Procedure: DILATATION AND EVACUATION;  Surgeon: Michael Litter, MD;  Location: [facility];  Service: Gynecology;  Laterality: N/A;  . WISDOM TOOTH EXTRACTION       OB History    Gravida Para Term Preterm AB Living   SAB TAB Ectopic Multiple Live Births   1 3             Home Medications    Prior to Admission medications   Medication Sig Start Date End Date Taking? Authorizing Provider  clindamycin (CLEOCIN) 300 MG capsule Take 1 capsule (300 mg total) by mouth 3 (three) times daily. 03/01/17 03/11/17  Cathie Hoops, Amy V, PA-C  triamcinolone cream (KENALOG) 0.1 % Apply 1 application topically 2 (two) times daily. 02/22/17   Belinda Fisher, PA-C    Family History Family History  Problem Relation Age of Onset  . Cancer Maternal Grandmother   . Diabetes Maternal Grandfather     Social History Social History  Substance Use Topics  . Smoking status: Never Smoker  . Smokeless tobacco: Never Used  . Alcohol use No     Allergies   Penicillins and Amoxil [amoxicillin]   Review of Systems Review of Systems  Reason unable to perform ROS: See HPI as above.     Physical Exam Triage Vital Signs ED Triage Vitals  Enc Vitals Group     BP 03/01/17 1027 (!) 111/58     Pulse Rate 03/01/17 1027 72     Resp 03/01/17 1027  16     Temp 03/01/17 1027 98.5 F (36.9 C)     Temp Source 03/01/17 1027 Oral     SpO2 03/01/17 1027 100 %     Weight 03/01/17 1028 125 lb (56.7 kg)     Height 03/01/17 1028  (1.676 m)     Head Circumference --      Peak Flow --      Pain Score 03/01/17 1029 1     Pain Loc --      Pain Edu? --      Excl. in [place]? --    No data found.   Updated Vital Signs BP (!) 111/58 (BP Location: Right Arm)   Pulse 72   Temp 98.5 F (36.9 C) (Oral)   Resp 16   Ht  (1.676 m)   Wt 125 lb (56.7 kg)   LMP 02/03/2017   SpO2 100%   BMI 20.18 kg/m     Physical Exam  Constitutional: She is oriented to person, place, and time. She appears well-developed and well-nourished. No distress.  HENT:  Head: Normocephalic and atraumatic.  Right Ear: Tympanic membrane, external ear and ear canal normal. Tympanic  membrane is not erythematous and not bulging.  Left Ear: Tympanic membrane, external ear and ear canal normal. Tympanic membrane is not erythematous and not bulging.  Nose: Nose normal. Right sinus exhibits no maxillary sinus tenderness and no frontal sinus tenderness. Left sinus exhibits no maxillary sinus tenderness and no frontal sinus tenderness.  Mouth/Throat: Uvula is midline, oropharynx is clear and moist and mucous membranes are normal.  Eyes: Pupils are equal, round, and reactive to light. Conjunctivae are normal.  Neck: Normal range of motion. Neck supple. No spinous process tenderness and no muscular tenderness present. Normal range of motion present.  Cardiovascular: Normal rate, regular rhythm and normal heart sounds.  Exam reveals no gallop and no friction rub.   No murmur heard. Pulmonary/Chest: Effort normal and breath sounds normal. She has no decreased breath sounds. She has no wheezes. She has no rhonchi. She has no rales.  Musculoskeletal:  No tenderness on palpation of spinous processes, bilateral back. Full range of motion. Range of motion elicits the low back pain.  Lymphadenopathy:    She has cervical adenopathy.  Neurological: She is alert and oriented to person, place, and time.  Skin: Skin is warm and dry.  Psychiatric: She has a normal mood and affect. Her behavior is normal. Judgment normal.     UC Treatments / Results  Labs (all labs ordered are listed, but only abnormal results are displayed) Labs Reviewed  POCT RAPID STREP A - Abnormal; Notable for the following:       Result Value   Streptococcus, Group A Screen (Direct) POSITIVE (*)    All other components within normal limits    EKG  EKG Interpretation None       Radiology No results found.  Procedures Procedures (including critical care time)  Medications Ordered in UC Medications - No data to display   Initial Impression / Assessment and Plan / UC Course  I have reviewed the triage  vital signs and the nursing notes.  Pertinent labs & imaging results that were available during my care of the patient were reviewed by me and considered in my medical decision making (see chart for details).    Rapid strep positive. Start antibiotic as directed. Discussed possible carrier of strep, and symptoms could be due to viral illness. Consider mono  testing if symptoms do not improve. Exam of back consistent with muscle strain. Start NSAIDs as directed. Return precautions given.  Final Clinical Impressions(s) / UC Diagnoses   Final diagnoses:  Strep pharyngitis  Muscle strain    New Prescriptions New Prescriptions   CLINDAMYCIN (CLEOCIN) 300 MG CAPSULE    Take 1 capsule (300 mg total) by mouth 3 (three) times daily.      Belinda Fisher, PA-C 03/01/17 1149

## 2017-03-01 NOTE — ED Triage Notes (Signed)
Patient reports positive strep test on Monday and was given prescription for zithromax (allergic to penicillin). Fever developed yesterday with right sided lymphnode noted to be swollen. Antibiotic completed.   Patient also reports lower back pain, reports moving x 2 in the last month. Reports doing ice and ibuprofen and physical therapy.

## 2017-06-25 ENCOUNTER — Ambulatory Visit (HOSPITAL_COMMUNITY)
Admission: EM | Admit: 2017-06-25 | Discharge: 2017-06-25 | Disposition: A | Discharge status: Home / Self Care | Payer: Medicaid Other | Attending: Family Medicine | Admitting: Family Medicine

## 2017-06-25 ENCOUNTER — Encounter (HOSPITAL_COMMUNITY): Payer: Self-pay | Admitting: Emergency Medicine

## 2017-06-25 DIAGNOSIS — J0111 Acute recurrent frontal sinusitis: Secondary | ICD-10-CM

## 2017-06-25 MED ORDER — AZITHROMYCIN 250 MG PO TABS: 250.0000 mg | ORAL_TABLET | Freq: Every day | ORAL | 0 refills | Status: DC

## 2017-06-25 MED ORDER — FLUCONAZOLE 150 MG PO TABS: 150.0000 mg | ORAL_TABLET | Freq: Once | ORAL | 0 refills | Status: AC

## 2017-06-25 NOTE — ED Provider Notes (Signed)
[facility]   [id number] 06/25/17 Arrival Time: 1013   SUBJECTIVE:  [name] is a 42 y.o. female who presents to the urgent care with complaint of prod cough, nasal congestion/drainage, facial pressure, headache, dizzy when changing position abruptly for one week.  Facial pressure is primarily frontal.  Has h/o sinus infections with identical symptoms.    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   Past Medical History:  Diagnosis Date  . Asthma 02/19/2012  . 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 History of abnormal Pap smear 02/19/2012   2002; had colpo's and questionable LEEP  . History of induced abortion 02/19/2012   EAB x3 w/ G1 ('00), G3 ('05), G4 ('06)  . Missed abortion 02/19/2012  . PONV (postoperative nausea and vomiting)   . Reflux   . SVD (spontaneous vaginal delivery)    x 3   Family History  Problem Relation Age of Onset  . Cancer Maternal Grandmother   . Diabetes Maternal Grandfather    Social History   Socioeconomic History  . Marital status: Single    Spouse name: Not on file  . Number of children: Not on file  . Years of education: Not on file  . Highest education level: Not on file  Social Needs  . Financial resource strain: Not on file  . Food insecurity - worry: Not on file  . Food insecurity - inability: Not on file  . Transportation needs - medical: Not on file  . Transportation needs - non-medical: Not on file  Occupational History  . Not on file  Tobacco Use  . Smoking status: Never Smoker  . Smokeless tobacco: Never Used  Substance and Sexual Activity  . Alcohol use: No  . Drug use: No  . Sexual activity: Yes    Birth control/protection: None  Other Topics Concern  . Not on file  Social History Narrative  . Not on file   No outpatient medications have been marked as taking for the 06/25/17 encounter [facility](Hospital Encounter).   Allergies  Allergen Reactions  . Penicillins Hives  . Amoxil  [Amoxicillin] Rash      ROS: As per HPI, remainder of ROS negative.   OBJECTIVE:   Vitals:   06/25/17 1054  BP: (!) 90/50  Pulse: 74  Resp: 18  Temp: 97.7 F (36.5 C)  TempSrc: Oral  SpO2: 100%     General appearance: alert; no distress Eyes: PERRL; EOMI; conjunctiva normal HENT: normocephalic; atraumatic; TMs normal, canal normal, external ears normal without trauma; nasal mucosa swollen, red, and covered with exudates; oral mucosa normal Neck: supple Lungs: clear to auscultation bilaterally Heart: regular rate and rhythm Back: no CVA tenderness Extremities: no cyanosis or edema; symmetrical with no gross deformities Skin: warm and dry Neurologic: normal gait; grossly normal Psychological: alert and cooperative; normal mood and affect    Results for orders placed or performed during the hospital encounter of 03/01/17  POCT rapid strep A [facility])  Result Value Ref Range   Streptococcus, Group A Screen (Direct) POSITIVE (A) NEGATIVE    Labs Reviewed - No data to display  No results found.     ASSESSMENT & PLAN:  1. Acute recurrent frontal sinusitis     Meds ordered this encounter  Medications  . azithromycin (ZITHROMAX) 250 MG tablet    Sig: Take 1 tablet (250 mg total) by mouth daily. Take first 2 tablets together, then 1 every  day until finished.    Dispense:  6 tablet    Refill:  0  . fluconazole (DIFLUCAN) 150 MG tablet    Sig: Take 1 tablet (150 mg total) by mouth once for 1 dose. Repeat if needed    Dispense:  2 tablet    Refill:  0    Reviewed expectations re: course of current medical issues. Questions answered. Outlined signs and symptoms indicating need for more acute intervention. Patient verbalized understanding. After Visit Summary given.       Elvina Sidle, MD 06/25/17 1118

## 2017-06-25 NOTE — ED Triage Notes (Signed)
PT C/O: cold sx onset 1 week associated w/prod cough, nasal congestion/drainage, facial pressure, headache, dizzy   DENIES: fevers  TAKING MEDS: OTC sinus meds/Sudafed  A&O x4... NAD... Ambulatory

## 2019-04-04 ENCOUNTER — Other Ambulatory Visit: Payer: Self-pay

## 2019-04-04 DIAGNOSIS — Z20822 Contact with and (suspected) exposure to covid-19: Secondary | ICD-10-CM

## 2019-04-05 LAB — NOVEL CORONAVIRUS, NAA: SARS-CoV-2, NAA: NOT DETECTED

## 2020-06-02 ENCOUNTER — Encounter (HOSPITAL_COMMUNITY): Payer: Self-pay | Admitting: *Deleted

## 2020-06-02 ENCOUNTER — Ambulatory Visit (HOSPITAL_COMMUNITY)
Admission: EM | Admit: 2020-06-02 | Discharge: 2020-06-02 | Disposition: A | Discharge status: Home / Self Care | Payer: 59 | Attending: Student | Admitting: Student

## 2020-06-02 ENCOUNTER — Other Ambulatory Visit: Payer: Self-pay

## 2020-06-02 DIAGNOSIS — J069 Acute upper respiratory infection, unspecified: Secondary | ICD-10-CM

## 2020-06-02 DIAGNOSIS — J45909 Unspecified asthma, uncomplicated: Secondary | ICD-10-CM | POA: Insufficient documentation

## 2020-06-02 DIAGNOSIS — J209 Acute bronchitis, unspecified: Secondary | ICD-10-CM

## 2020-06-02 DIAGNOSIS — U071 COVID-19: Secondary | ICD-10-CM | POA: Insufficient documentation

## 2020-06-02 DIAGNOSIS — R059 Cough, unspecified: Secondary | ICD-10-CM | POA: Diagnosis present

## 2020-06-02 DIAGNOSIS — Z880 Allergy status to penicillin: Secondary | ICD-10-CM | POA: Diagnosis not present

## 2020-06-02 LAB — RESP PANEL BY RT-PCR (FLU A&B, COVID) ARPGX2
Influenza A by PCR: NEGATIVE
Influenza B by PCR: NEGATIVE
SARS Coronavirus 2 by RT PCR: POSITIVE — AB

## 2020-06-02 MED ORDER — ALBUTEROL SULFATE HFA 108 (90 BASE) MCG/ACT IN AERS: 1.0000 | INHALATION_SPRAY | Freq: Four times a day (QID) | RESPIRATORY_TRACT | 0 refills | Status: AC | PRN

## 2020-06-02 MED ORDER — ONDANSETRON HCL 8 MG PO TABS: 8.0000 mg | ORAL_TABLET | Freq: Three times a day (TID) | ORAL | 0 refills | Status: AC | PRN

## 2020-06-02 MED ORDER — ALBUTEROL SULFATE HFA 108 (90 BASE) MCG/ACT IN AERS: 1.0000 | INHALATION_SPRAY | Freq: Four times a day (QID) | RESPIRATORY_TRACT | 0 refills | Status: DC | PRN

## 2020-06-02 MED ORDER — PREDNISONE 10 MG (21) PO TBPK: ORAL_TABLET | Freq: Every day | ORAL | 0 refills | Status: DC

## 2020-06-02 NOTE — Discharge Instructions (Signed)
-  Continue albuterol inhaler -Prednisone taper for cough/bronchitis. I recommend taking this in the morning as it could give you energy.  -zofran for nausea and vomiting  Return precautions- chest pain, shortness of breath, new/worsening fevers/chills, confusion, worsening of symptoms despite the above treatment plan, etc.   We are currently awaiting result of your PCR covid-19 test. Please isolate at home while awaiting these results. If your test is positive for Covid-19, continue to isolate at home for a total of 10 days from symptom onset. Treat your symptoms at home with OTC remedies like tylenol/ibuprofen, mucinex, nyquil, etc. Seek medical attention if you develop high fevers, chest pain, shortness of breath, ear pain, facial pain, etc. Make sure to get up and move around every 2-3 hours while convalescing to help prevent blood clots. Drink plenty of fluids, and rest as much as possible.

## 2020-06-02 NOTE — ED Provider Notes (Addendum)
[facility]    CSN: [id number] Arrival date & time: 06/02/20  1004      History   Chief Complaint Chief Complaint  Patient presents with  . Cough  . Headache    HPI ETOY MCDONNELL is a 45 y.o. female Presenting for URI symptoms for 2 days. History of asthma controlled on albuterol, GERD. Endorses 3 days of productive cough, congestion, PND, occ headache behind right eye. Headache is controlled on tylenol/ibuprofen.  Denies worst headache of life, thunderclap headache, weakness/sensation changes in arms/legs, vision changes, shortness of breath, chest pain/pressure, photophobia, phonophobia. endoress nausea but thinks this is due to mucus. Endorses loss of taste and smell. Denies fevers/chills, shortness of breath, chest pain, facial pain, teeth pain, sore throat, swollen lymph nodes, ear pain.  Denies chest pain, shortness of breath, confusion, high fevers.  Fully vaccinated for covid-19.   HPI  Past Medical History:  Diagnosis Date  . Asthma 02/19/2012  . 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 History of abnormal Pap smear 02/19/2012   2002; had colpo's and questionable LEEP  . History of induced abortion 02/19/2012   EAB x3 w/ G1 ('00), G3 ('05), G4 ('06)  . Missed abortion 02/19/2012  . PONV (postoperative nausea and vomiting)   . Reflux   . SVD (spontaneous vaginal delivery)    x 3    Patient Active Problem List   Diagnosis Date Noted  . Missed abortion 02/19/2012  . History of induced abortion 02/19/2012  . 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 Asthma 02/19/2012  . History of abnormal Pap smear 02/19/2012  . Acid reflux 12/31/2011  . Foot pain, left 11/21/2010    Past Surgical History:  Procedure Laterality Date  . DILATION AND EVACUATION  02/19/2012   Procedure: DILATATION AND EVACUATION;  Surgeon: Michael Litter, MD;  Location: [facility];  Service: Gynecology;  Laterality: N/A;  . WISDOM TOOTH EXTRACTION      OB History    Gravida  9   Para  3   Term  3    Preterm      AB  4   Living  3     SAB  1   IAB  3   Ectopic      Multiple      Live Births               Home Medications    Prior to Admission medications   Medication Sig Start Date End Date Taking? Authorizing Provider  ondansetron (ZOFRAN) 8 MG tablet Take 1 tablet (8 mg total) by mouth every 8 (eight) hours as needed for nausea or vomiting. 06/02/20  Yes Rhys Martini, PA-C  predniSONE (STERAPRED UNI-PAK 21 TAB) 10 MG (21) TBPK tablet Take by mouth daily. Take 6 tabs by mouth daily  for 2 days, then 5 tabs for 2 days, then 4 tabs for 2 days, then 3 tabs for 2 days, 2 tabs for 2 days, then 1 tab by mouth daily for 2 days 06/02/20  Yes Cheree Ditto, Lyman Speller, PA-C  azithromycin (ZITHROMAX) 250 MG tablet Take 1 tablet (250 mg total) by mouth daily. Take first 2 tablets together, then 1 every day until finished. 06/25/17   Elvina Sidle, MD  triamcinolone cream (KENALOG) 0.1 % Apply 1 application topically 2 (two) times daily. 02/22/17   Belinda Fisher, PA-C    Family History Family History  Problem Relation Age of Onset  . Cancer Maternal Grandmother   .  Diabetes Maternal Grandfather     Social History Social History   Tobacco Use  . Smoking status: Never Smoker  . Smokeless tobacco: Never Used  Substance Use Topics  . Alcohol use: No  . Drug use: No     Allergies   Penicillins and Amoxil [amoxicillin]   Review of Systems Review of Systems  Constitutional: Positive for appetite change. Negative for chills and fever.  HENT: Positive for congestion. Negative for ear pain, rhinorrhea, sinus pressure, sinus pain and sore throat.   Eyes: Negative for redness and visual disturbance.  Respiratory: Positive for cough. Negative for chest tightness, shortness of breath and wheezing.   Cardiovascular: Negative for chest pain and palpitations.  Gastrointestinal: Positive for diarrhea and nausea. Negative for abdominal pain, constipation and vomiting.  Genitourinary: Negative  for dysuria, frequency and urgency.  Musculoskeletal: Negative for myalgias.  Neurological: Negative for dizziness, weakness and headaches.  Psychiatric/Behavioral: Negative for confusion.  All other systems reviewed and are negative.    Physical Exam Triage Vital Signs ED Triage Vitals  Enc Vitals Group     BP 06/02/20 1024 (!) 86/45     Pulse Rate 06/02/20 1024 79     Resp 06/02/20 1024 18     Temp 06/02/20 1024 98.3 F (36.8 C)     Temp Source 06/02/20 1024 Oral     SpO2 06/02/20 1024 100 %     Weight --      Height --      Head Circumference --      Peak Flow --      Pain Score 06/02/20 1028 7     Pain Loc --      Pain Edu? --      Excl. in [place]? --    No data found.  Updated Vital Signs BP (!) 86/45 (BP Location: Right Arm)   Pulse 79   Temp 98.3 F (36.8 C) (Oral)   Resp 18   LMP 05/30/2020   SpO2 100%   Visual Acuity Right Eye Distance:   Left Eye Distance:   Bilateral Distance:    Right Eye Near:   Left Eye Near:    Bilateral Near:     Physical Exam Vitals reviewed.  Constitutional:      General: She is not in acute distress.    Appearance: Normal appearance. She is ill-appearing.  HENT:     Head: Normocephalic and atraumatic.     Right Ear: Hearing, tympanic membrane, ear canal and external ear normal. No swelling or tenderness. There is no impacted cerumen. No mastoid tenderness. Tympanic membrane is not perforated, erythematous, retracted or bulging.     Left Ear: Hearing, tympanic membrane, ear canal and external ear normal. No swelling or tenderness. There is no impacted cerumen. No mastoid tenderness. Tympanic membrane is not perforated, erythematous, retracted or bulging.     Nose:     Right Sinus: No maxillary sinus tenderness or frontal sinus tenderness.     Left Sinus: No maxillary sinus tenderness or frontal sinus tenderness.     Mouth/Throat:     Mouth: Mucous membranes are moist.     Pharynx: Uvula midline. No oropharyngeal exudate or  posterior oropharyngeal erythema.     Tonsils: No tonsillar exudate.  Cardiovascular:     Rate and Rhythm: Normal rate and regular rhythm.     Heart sounds: Normal heart sounds.  Pulmonary:     Breath sounds: Normal breath sounds and air entry. No wheezing, rhonchi or rales.  Chest:     Chest wall: No tenderness.  Abdominal:     General: Abdomen is flat. Bowel sounds are normal.     Tenderness: There is no abdominal tenderness. There is no guarding or rebound.  Lymphadenopathy:     Cervical: No cervical adenopathy.  Neurological:     General: No focal deficit present.     Mental Status: She is alert and oriented to person, place, and time.  Psychiatric:        Attention and Perception: Attention and perception normal.        Mood and Affect: Mood and affect normal.        Behavior: Behavior normal. Behavior is cooperative.        Thought Content: Thought content normal.        Judgment: Judgment normal.      UC Treatments / Results  Labs (all labs ordered are listed, but only abnormal results are displayed) Labs Reviewed  RESP PANEL BY RT-PCR (FLU A&B, COVID) [id number]    EKG   Radiology No results found.  Procedures Procedures (including critical care time)  Medications Ordered in UC Medications - No data to display  Initial Impression / Assessment and Plan / UC Course  I have reviewed the triage vital signs and the nursing notes.  Pertinent labs & imaging results that were available during my care of the patient were reviewed by me and considered in my medical decision making (see chart for details).     Covid and influenza tests sent today. Patient is fully vaccinated for covid-19. Isolation precautions per CDC guidelines until negative result. Symptomatic relief with OTC Mucinex, Nyquil, etc. Return precautions- new/worsening fevers/chills, shortness of breath, chest pain, abd pain, etc.   Prednisone taper and zofran as below.   BP 86/45 today. BP trends for  last 3 years are in similar range. Pt is hydrating and eating. She does not have diabetes.   She requests albuterol inhaler refill. Sent.   Final Clinical Impressions(s) / UC Diagnoses   Final diagnoses:  Acute upper respiratory infection  Acute bronchitis, unspecified organism     Discharge Instructions     -Continue albuterol inhaler -Prednisone taper for cough/bronchitis. I recommend taking this in the morning as it could give you energy.  -zofran for nausea and vomiting  Return precautions- chest pain, shortness of breath, new/worsening fevers/chills, confusion, worsening of symptoms despite the above treatment plan, etc.   We are currently awaiting result of your PCR covid-19 test. Please isolate at home while awaiting these results. If your test is positive for Covid-19, continue to isolate at home for a total of 10 days from symptom onset. Treat your symptoms at home with OTC remedies like tylenol/ibuprofen, mucinex, nyquil, etc. Seek medical attention if you develop high fevers, chest pain, shortness of breath, ear pain, facial pain, etc. Make sure to get up and move around every 2-3 hours while convalescing to help prevent blood clots. Drink plenty of fluids, and rest as much as possible.     ED Prescriptions    Medication Sig Dispense Auth. Provider   predniSONE (STERAPRED UNI-PAK 21 TAB) 10 MG (21) TBPK tablet Take by mouth daily. Take 6 tabs by mouth daily  for 2 days, then 5 tabs for 2 days, then 4 tabs for 2 days, then 3 tabs for 2 days, 2 tabs for 2 days, then 1 tab by mouth daily for 2 days 42 tablet Ignacia Bayley E, PA-C   ondansetron [facility]) 8  MG tablet Take 1 tablet (8 mg total) by mouth every 8 (eight) hours as needed for nausea or vomiting. 20 tablet Rhys Martini, PA-C     PDMP not reviewed this encounter.   Rhys Martini, PA-C 06/02/20 1052    Rhys Martini, PA-C 06/02/20 1053    Rhys Martini, PA-C 06/02/20 1105

## 2020-06-02 NOTE — ED Triage Notes (Signed)
Pt reports Cough ,HA and congestion since Friday. Pt has tried OTC with out relief.

## 2020-06-03 ENCOUNTER — Telehealth (HOSPITAL_COMMUNITY): Payer: Self-pay

## 2020-06-03 NOTE — Telephone Encounter (Signed)
Called to discuss with patient about COVID-19 symptoms and the use of one of the available treatments for those with mild to moderate Covid symptoms and at a high risk of hospitalization.     Pt appears to qualify for this infusion due to co-morbid conditions and/or a member of an at-risk group in accordance with the FDA Emergency Use Authorization.    Unable to reach pt - Left VM message to return call at [phone] and also left MyChart Message.   Angelia Mould

## 2021-01-06 ENCOUNTER — Other Ambulatory Visit: Payer: Self-pay

## 2021-01-06 ENCOUNTER — Ambulatory Visit
Admission: RE | Admit: 2021-01-06 | Discharge: 2021-01-06 | Disposition: A | Discharge status: Home / Self Care | Payer: 59 | Source: Ambulatory Visit | Attending: Emergency Medicine | Admitting: Emergency Medicine

## 2021-01-06 VITALS — BP 112/69 | HR 63 | Temp 98.6°F | Resp 18

## 2021-01-06 DIAGNOSIS — M25532 Pain in left wrist: Secondary | ICD-10-CM

## 2021-01-06 DIAGNOSIS — W57XXXA Bitten or stung by nonvenomous insect and other nonvenomous arthropods, initial encounter: Secondary | ICD-10-CM | POA: Diagnosis not present

## 2021-01-06 MED ORDER — IBUPROFEN 800 MG PO TABS: 800.0000 mg | ORAL_TABLET | Freq: Three times a day (TID) | ORAL | 0 refills | Status: AC | PRN

## 2021-01-06 NOTE — ED Provider Notes (Signed)
[name]    CSN: [id number] Arrival date & time: 01/06/21  7253      History   Chief Complaint Chief Complaint  Patient presents with   Appointment    0900   Insect Bite   Wrist Pain    Right hand insect bite today  Left wrist pain x 5 days     HPI [name] is a 45 y.o. female.  Patient presents with 5-day history of left wrist pain.  No falls or injury.  The pain is worse with side-to-side movement.  It started after she was weed-eating a large portion of her yard.  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  No treatments attempted at home.  Patient also presents with a mosquito bite on her right thumb since this morning.  No treatments attempted at home.  Patient denies fever, chills, numbness, weakness, paresthesias, redness, bruising, open wounds, or other symptoms.  The history is provided by the patient and medical records.   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   Asthma 02/19/2012   History of abnormal Pap smear 02/19/2012   Acid reflux 12/31/2011   Foot pain, left 11/21/2010    Past Surgical History:  Procedure Laterality Date   DILATION AND EVACUATION  02/19/2012   Procedure: DILATATION AND EVACUATION;  Surgeon: Michael Litter, MD;  Location: [facility];  Service: Gynecology;  Laterality: N/A;   WISDOM TOOTH EXTRACTION      OB History     Gravida  9   Para  3   Term  3   Preterm      AB  4   Living  3       SAB  1   IAB  3   Ectopic      Multiple      Live Births               Home Medications    Prior to Admission medications   Medication Sig Start Date End Date Taking? Authorizing Provider  ibuprofen (ADVIL) 800 MG tablet Take 1 tablet (800 mg total) by mouth every 8 (eight) hours as needed. 01/06/21  Yes Mickie Bail, NP  albuterol (VENTOLIN HFA) 108 (90 Base) MCG/ACT inhaler Inhale 1-2 puffs into the lungs every 6 (six) hours as needed for wheezing or shortness of breath. 06/02/20   Rhys Martini, PA-C  azithromycin (ZITHROMAX) 250 MG tablet Take 1 tablet (250 mg total) by mouth daily. Take first 2 tablets together, then 1 every day until finished. 06/25/17   Elvina Sidle, MD  ondansetron (ZOFRAN) 8 MG tablet Take 1 tablet (8 mg total) by mouth every 8 (eight) hours as needed for nausea or vomiting. 06/02/20   Rhys Martini, PA-C  predniSONE (STERAPRED UNI-PAK 21 TAB) 10 MG (21) TBPK  tablet Take by mouth daily. Take 6 tabs by mouth daily  for 2 days, then 5 tabs for 2 days, then 4 tabs for 2 days, then 3 tabs for 2 days, 2 tabs for 2 days, then 1 tab by mouth daily for 2 days 06/02/20   Rhys Martini, PA-C  triamcinolone cream (KENALOG) 0.1 % Apply 1 application topically 2 (two) times daily. 02/22/17   Belinda Fisher, PA-C    Family History Family History  Problem Relation Age of Onset   Cancer Maternal Grandmother    Diabetes Maternal Grandfather     Social History Social History   Tobacco Use   Smoking status: Never   Smokeless tobacco: Never  Substance Use Topics   Alcohol use: No   Drug use: No     Allergies   Penicillins and Amoxil [amoxicillin]   Review of Systems Review of Systems  Constitutional:  Negative for chills and fever.  Respiratory:  Negative for cough and shortness of breath.   Cardiovascular:  Negative for chest pain and palpitations.  Musculoskeletal:  Positive for arthralgias. Negative for joint swelling.  Skin:  Positive for wound.  Negative for color change and rash.  Neurological:  Negative for weakness and numbness.  All other systems reviewed and are negative.   Physical Exam Triage Vital Signs ED Triage Vitals  Enc Vitals Group     BP      Pulse      Resp      Temp      Temp src      SpO2      Weight      Height      Head Circumference      Peak Flow      Pain Score      Pain Loc      Pain Edu?      Excl. in GC?    No data found.  Updated Vital Signs BP 112/69 (BP Location: Left Arm)   Pulse 63   Temp 98.6 F (37 C) (Oral)   Resp 18   LMP 12/23/2020 (Approximate)   SpO2 99%   Visual Acuity Right Eye Distance:   Left Eye Distance:   Bilateral Distance:    Right Eye Near:   Left Eye Near:    Bilateral Near:     Physical Exam Vitals and nursing note reviewed.  Constitutional:      General: She is not in acute distress.    Appearance: She is well-developed. She is not ill-appearing.  HENT:     Head: Normocephalic and atraumatic.     Mouth/Throat:     Mouth: Mucous membranes are moist.  Eyes:     Conjunctiva/sclera: Conjunctivae normal.  Cardiovascular:     Rate and Rhythm: Normal rate and regular rhythm.     Heart sounds: Normal heart sounds.  Pulmonary:     Effort: Pulmonary effort is normal. No respiratory distress.     Breath sounds: Normal breath sounds.  Abdominal:     Palpations: Abdomen is soft.     Tenderness: There is no abdominal tenderness.  Musculoskeletal:        General: No swelling, tenderness, deformity or signs of injury. Normal range of motion.       Hands:     Cervical back: Neck supple.     Comments: BUE: 2+ pulses, sensation intact, FROM, brisk cap refill. No erythema, ecchymosis, or edema.  Left wrist/forearm: nontender to palpation.  Right thumb: Flesh-colored  0.5 cm mosquito bite. No open wounds or erythema.   Skin:    General: Skin is warm and dry.     Capillary Refill: Capillary refill takes less than 2 seconds.     Findings: Lesion present. No  bruising, erythema or rash.  Neurological:     General: No focal deficit present.     Mental Status: She is alert and oriented to person, place, and time.     Sensory: No sensory deficit.     Motor: No weakness.     Gait: Gait normal.  Psychiatric:        Mood and Affect: Mood normal.        Behavior: Behavior normal.     UC Treatments / Results  Labs (all labs ordered are listed, but only abnormal results are displayed) Labs Reviewed - No data to display  EKG   Radiology No results found.  Procedures Procedures (including critical care time)  Medications Ordered in UC Medications - No data to display  Initial Impression / Assessment and Plan / UC Course  I have reviewed the triage vital signs and the nursing notes.  Pertinent labs & imaging results that were available during my care of the patient were reviewed by me and considered in my medical decision making (see chart for details).   Left wrist pain.  No indication of injury or infection.  Treating with ibuprofen, rest, elevation, ice packs, wrist brace.  Work note provided per patient request.  Instructed her to follow-up with an orthopedist if her symptoms or not improving.  Mosquito bite on right thumb.  Instructed patient to take Benadryl as needed for itching.  She agrees to plan of care.   Final Clinical Impressions(s) / UC Diagnoses   Final diagnoses:  Left wrist pain  Mosquito bite, initial encounter     Discharge Instructions      Take the ibuprofen as prescribed.  Rest and elevate your wrist.  Apply ice packs 2-3 times a day for up to 20 minutes each.  Wear the wrist brace as needed for comfort.    Follow up with an orthopedist if your symptoms are not improving.        ED Prescriptions     Medication Sig Dispense Auth. Provider   ibuprofen (ADVIL) 800 MG tablet Take 1 tablet (800 mg total) by mouth every 8 (eight) hours as needed. 21 tablet Mickie Bail, NP      I have reviewed the PDMP  during this encounter.   Mickie Bail, NP 01/06/21 [phone]

## 2021-01-06 NOTE — Discharge Instructions (Addendum)
Take the ibuprofen as prescribed.  Rest and elevate your wrist.  Apply ice packs 2-3 times a day for up to 20 minutes each.  Wear the wrist brace as needed for comfort.    Follow up with an orthopedist if your symptoms are not improving.

## 2021-01-06 NOTE — ED Triage Notes (Signed)
Patient presents to Urgent Care with complaints of left wrist pain x 5 days ago. Pt believes it is related to excessively using her hand these past several weeks. She also concerned with right thumb swelling believes related to possible insect bite today. Not taking anything for pain.

## 2022-03-27 ENCOUNTER — Telehealth: Payer: Self-pay

## 2022-03-27 NOTE — Telephone Encounter (Signed)
Telephoned patient at mobile number. Left voice message with [employer] ([employer]) contact information. 

## 2022-06-03 ENCOUNTER — Telehealth: Payer: Self-pay

## 2022-06-03 NOTE — Telephone Encounter (Signed)
Telephoned patient at mobile number. Phone answered and disconnected. [employer] ([employer]) did not leave a message.

## 2023-01-11 ENCOUNTER — Ambulatory Visit
Admission: RE | Admit: 2023-01-11 | Discharge: 2023-01-11 | Disposition: A | Discharge status: Home / Self Care | Payer: Self-pay | Source: Ambulatory Visit | Attending: Family Medicine | Admitting: Family Medicine

## 2023-01-11 VITALS — BP 111/71 | HR 66 | Temp 99.1°F | Resp 18

## 2023-01-11 DIAGNOSIS — H60392 Other infective otitis externa, left ear: Secondary | ICD-10-CM

## 2023-01-11 MED ORDER — PREDNISONE 20 MG PO TABS: 20.0000 mg | ORAL_TABLET | Freq: Every day | ORAL | 0 refills | Status: AC

## 2023-01-11 MED ORDER — FLUCONAZOLE 150 MG PO TABS: 150.0000 mg | ORAL_TABLET | ORAL | 0 refills | Status: AC | PRN

## 2023-01-11 MED ORDER — AZITHROMYCIN 250 MG PO TABS: ORAL_TABLET | ORAL | 0 refills | Status: AC

## 2023-01-11 NOTE — ED Triage Notes (Signed)
Patient to Urgent Care with complaints of ear pain that started yesterday afternoon. Reports concerns about possibly hurting her ear with a q-tip. Pain radiates into jaw and neck.   Taking ibuprofen.

## 2023-01-11 NOTE — ED Provider Notes (Signed)
Renaldo Fiddler    CSN: [id number] Arrival date & time: 01/11/23  1759      History   Chief Complaint Chief Complaint  Patient presents with   Ear Injury    I don't know if I have an ear infection or if I hurt my ear with a qtip - Entered by patient    HPI DANENE SANTAMARINA is a 47 y.o. female.   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   Asthma 02/19/2012   History of abnormal Pap smear 02/19/2012   Acid reflux 12/31/2011   Foot pain, left 11/21/2010    Past Surgical History:  Procedure Laterality Date   DILATION AND EVACUATION  02/19/2012   Procedure: DILATATION AND EVACUATION;  Surgeon: Michael Litter, MD;  Location: [facility];  Service: Gynecology;  Laterality: N/A;   WISDOM TOOTH EXTRACTION      OB History     Gravida  9   Para  3   Term  3   Preterm      AB  4   Living  3      SAB  1   IAB  3   Ectopic      Multiple      Live Births               Home Medications    Prior to Admission medications   Medication Sig Start Date End Date Taking? Authorizing Provider  albuterol (VENTOLIN HFA) 108 (90 Base) MCG/ACT inhaler Inhale 1-2 puffs into the lungs every 6 (six) hours as needed for wheezing or shortness of breath. 06/02/20   Rhys Martini, PA-C  azithromycin (ZITHROMAX) 250 MG tablet Take 1 tablet (250 mg total) by mouth daily. Take first 2 tablets together, then 1 every day until finished. Patient not taking: Reported on 01/11/2023  06/25/17   Elvina Sidle, MD  ibuprofen (ADVIL) 800 MG tablet Take 1 tablet (800 mg total) by mouth every 8 (eight) hours as needed. 01/06/21   Mickie Bail, NP  ondansetron (ZOFRAN) 8 MG tablet Take 1 tablet (8 mg total) by mouth every 8 (eight) hours as needed for nausea or vomiting. 06/02/20   Rhys Martini, PA-C  predniSONE (STERAPRED UNI-PAK 21 TAB) 10 MG (21) TBPK tablet Take by mouth daily. Take 6 tabs by mouth daily  for 2 days, then 5 tabs for 2 days, then 4 tabs for 2 days, then 3 tabs for 2 days, 2 tabs for 2 days, then 1 tab by mouth daily for 2 days Patient not taking: Reported on 01/11/2023 06/02/20   Rhys Martini, PA-C  triamcinolone cream (KENALOG) 0.1 % Apply 1 application topically 2 (two) times daily. 02/22/17   Belinda Fisher, PA-C    Family History Family History  Problem Relation Age of Onset   Cancer Maternal Grandmother    Diabetes  Maternal Grandfather     Social History Social History   Tobacco Use   Smoking status: Never   Smokeless tobacco: Never  Substance Use Topics   Alcohol use: No   Drug use: No     Allergies   Penicillins and Amoxil [amoxicillin]   Review of Systems Review of Systems   Physical Exam Triage Vital Signs ED Triage Vitals  Encounter Vitals Group     BP 01/11/23 1843 111/71     Systolic BP Percentile --      Diastolic BP Percentile --      Pulse Rate 01/11/23 1843 66     Resp 01/11/23 1843 18     Temp 01/11/23 1843 99.1 F (37.3 C)     Temp src --      SpO2 01/11/23 1843 98 %     Weight --      Height --      Head Circumference --      Peak Flow --      Pain Score 01/11/23 1837 8     Pain Loc --      Pain Education --      Exclude from Growth Chart --    No data found.  Updated Vital Signs BP 111/71   Pulse 66   Temp 99.1 F (37.3 C)   Resp 18   LMP 01/09/2023   SpO2 98%   Visual Acuity Right Eye Distance:   Left Eye Distance:   Bilateral Distance:    Right Eye Near:   Left Eye Near:    Bilateral  Near:     Physical Exam   UC Treatments / Results  Labs (all labs ordered are listed, but only abnormal results are displayed) Labs Reviewed - No data to display  EKG   Radiology No results found.  Procedures Procedures (including critical care time)  Medications Ordered in UC Medications - No data to display  Initial Impression / Assessment and Plan / UC Course  I have reviewed the triage vital signs and the nursing notes.  Pertinent labs & imaging results that were available during my care of the patient were reviewed by me and considered in my medical decision making (see chart for details).     *** Final Clinical Impressions(s) / UC Diagnoses   Final diagnoses:  None   Discharge Instructions   None    ED Prescriptions   None    PDMP not reviewed this encounter.

## 2024-03-12 ENCOUNTER — Ambulatory Visit: Payer: Self-pay
